# Patient Record
Sex: Male | Born: 1970 | Hispanic: Yes | Marital: Single | Smoking: Never smoker
Health system: Southern US, Community
[De-identification: ages and names within clinical notes are randomized; demographics above are authoritative.]

---

## 2014-09-09 ENCOUNTER — Inpatient Hospital Stay (HOSPITAL_COMMUNITY)
Admission: EM | Admit: 2014-09-09 | Discharge: 2014-09-12 | DRG: 074 | Disposition: A | Discharge status: Home / Self Care | Payer: Self-pay | Attending: Internal Medicine | Admitting: Internal Medicine

## 2014-09-09 ENCOUNTER — Encounter (HOSPITAL_COMMUNITY): Payer: Self-pay | Admitting: Emergency Medicine

## 2014-09-09 ENCOUNTER — Emergency Department (HOSPITAL_COMMUNITY): Payer: Self-pay

## 2014-09-09 DIAGNOSIS — R739 Hyperglycemia, unspecified: Secondary | ICD-10-CM | POA: Diagnosis present

## 2014-09-09 DIAGNOSIS — E876 Hypokalemia: Secondary | ICD-10-CM | POA: Diagnosis present

## 2014-09-09 DIAGNOSIS — IMO0002 Reserved for concepts with insufficient information to code with codable children: Secondary | ICD-10-CM | POA: Diagnosis present

## 2014-09-09 DIAGNOSIS — E1165 Type 2 diabetes mellitus with hyperglycemia: Secondary | ICD-10-CM | POA: Diagnosis present

## 2014-09-09 DIAGNOSIS — R609 Edema, unspecified: Secondary | ICD-10-CM

## 2014-09-09 DIAGNOSIS — M869 Osteomyelitis, unspecified: Secondary | ICD-10-CM

## 2014-09-09 DIAGNOSIS — Z794 Long term (current) use of insulin: Secondary | ICD-10-CM

## 2014-09-09 DIAGNOSIS — E11610 Type 2 diabetes mellitus with diabetic neuropathic arthropathy: Principal | ICD-10-CM | POA: Diagnosis present

## 2014-09-09 LAB — BASIC METABOLIC PANEL
Anion gap: 8 (ref 5–15)
BUN: 18 mg/dL (ref 6–23)
CALCIUM: 9.3 mg/dL (ref 8.4–10.5)
CHLORIDE: 100 mmol/L (ref 96–112)
CO2: 26 mmol/L (ref 19–32)
CREATININE: 0.69 mg/dL (ref 0.50–1.35)
GFR calc Af Amer: >90 mL/min (ref >90)
GFR calc non Af Amer: >90 mL/min (ref >90)
Glucose, Bld: 328 mg/dL — ABNORMAL HIGH (ref 70–99)
Potassium: 4.1 mmol/L (ref 3.5–5.1)
Sodium: 134 mmol/L — ABNORMAL LOW (ref 135–145)

## 2014-09-09 LAB — CBC WITH DIFFERENTIAL/PLATELET
BASOS ABS: 0.1 10*3/uL (ref 0.0–0.1)
Basophils Relative: 1 % (ref 0–1)
Eosinophils Absolute: 0.2 10*3/uL (ref 0.0–0.7)
Eosinophils Relative: 2 % (ref 0–5)
HCT: 43.1 % (ref 39.0–52.0)
Hemoglobin: 15 g/dL (ref 13.0–17.0)
LYMPHS ABS: 2.6 10*3/uL (ref 0.7–4.0)
LYMPHS PCT: 25 % (ref 12–46)
MCH: 27.3 pg (ref 26.0–34.0)
MCHC: 34.8 g/dL (ref 30.0–36.0)
MCV: 78.5 fL (ref 78.0–100.0)
Monocytes Absolute: 0.6 10*3/uL (ref 0.1–1.0)
Monocytes Relative: 6 % (ref 3–12)
NEUTROS ABS: 6.7 10*3/uL (ref 1.7–7.7)
Neutrophils Relative %: 66 % (ref 43–77)
PLATELETS: 312 10*3/uL (ref 150–400)
RBC: 5.49 MIL/uL (ref 4.22–5.81)
RDW: 12.8 % (ref 11.5–15.5)
WBC: 10.2 10*3/uL (ref 4.0–10.5)

## 2014-09-09 MED ORDER — INSULIN ASPART 100 UNIT/ML ~~LOC~~ SOLN
0.0000 [IU] | SUBCUTANEOUS | Status: DC
  Administered 2014-09-10: 2 [IU] via SUBCUTANEOUS
  Administered 2014-09-10: 3 [IU] via SUBCUTANEOUS
  Administered 2014-09-10: 7 [IU] via SUBCUTANEOUS
  Administered 2014-09-10 (×2): 5 [IU] via SUBCUTANEOUS
  Administered 2014-09-11: 2 [IU] via SUBCUTANEOUS
  Administered 2014-09-11: 3 [IU] via SUBCUTANEOUS
  Administered 2014-09-11: 2 [IU] via SUBCUTANEOUS

## 2014-09-09 MED ORDER — IOHEXOL 300 MG/ML  SOLN
100.0000 mL | Freq: Once | INTRAMUSCULAR | Status: AC | PRN
  Administered 2014-09-09: 100 mL via INTRAVENOUS

## 2014-09-09 MED ORDER — SODIUM CHLORIDE 0.9 % IV SOLN
INTRAVENOUS | Status: DC
  Administered 2014-09-09 – 2014-09-11 (×4): via INTRAVENOUS

## 2014-09-09 MED ORDER — HYDROCODONE-ACETAMINOPHEN 5-325 MG PO TABS
2.0000 | ORAL_TABLET | Freq: Once | ORAL | Status: AC
  Administered 2014-09-09: 2 via ORAL

## 2014-09-09 MED ORDER — HEPARIN SODIUM (PORCINE) 5000 UNIT/ML IJ SOLN
5000.0000 [IU] | Freq: Three times a day (TID) | INTRAMUSCULAR | Status: DC
  Administered 2014-09-09 – 2014-09-12 (×9): 5000 [IU] via SUBCUTANEOUS
  Filled 2014-09-09 (×9): qty 1

## 2014-09-09 MED ORDER — MORPHINE SULFATE 2 MG/ML IJ SOLN
1.0000 mg | INTRAMUSCULAR | Status: DC | PRN
  Administered 2014-09-11 – 2014-09-12 (×2): 2 mg via INTRAVENOUS
  Filled 2014-09-09 (×2): qty 1

## 2014-09-09 MED ORDER — HYDROCODONE-ACETAMINOPHEN 5-325 MG PO TABS
ORAL_TABLET | ORAL | Status: AC
  Filled 2014-09-09: qty 2

## 2014-09-09 NOTE — ED Provider Notes (Addendum)
CSN: [id number]     Arrival date & time 09/09/14  1801 History   First MD Initiated Contact with Patient 09/09/14 1915    This chart was scribed for Doug Sou, MD by Marica Otter, ED Scribe. This patient was seen in room [facility] and the patient's care was started at 7:20 PM.  Chief Complaint  Patient presents with  . Foot Pain   The history is provided by the patient. No language interpreter was used.   Pcp: No PCP Per Patient HPI Comments: Ronnie Yates is a 44 y.o. male who presents to the Emergency Department complaining of atraumatic,  constant left foot pain with associated swelling onset 2 weeks ago. Pt reports the pain is made worse with walking. Pt reports taking ibuprofen at home with some relief. Pt denies fever; chronic health conditions; tobacco use; recreational drug use; EtOH use; medicinal allergies. No other associated symptoms  History reviewed. No pertinent past medical history. History reviewed. No pertinent past surgical history. History reviewed. No pertinent family history. History  Substance Use Topics  . Smoking status: Never Smoker   . Smokeless tobacco: Not on file  . Alcohol Use: No    Review of Systems  Constitutional: Negative.   HENT: Negative.   Respiratory: Negative.   Cardiovascular: Negative.   Gastrointestinal: Negative.   Musculoskeletal: Positive for arthralgias.       Left foot pain  Skin: Negative.   Neurological: Negative.   Psychiatric/Behavioral: Negative.   All other systems reviewed and are negative.     Allergies  Review of patient's allergies indicates no known allergies.  Home Medications   Prior to Admission medications   Medication Sig Start Date End Date Taking? Authorizing Provider  ibuprofen (ADVIL,MOTRIN) 200 MG tablet Take 400 mg by mouth every 6 (six) hours as needed for mild pain or moderate pain.   Yes Historical Provider, MD   Triage Vitals: BP 155/94 mmHg  Pulse 96  Temp(Src) 97.8 F (36.6 C) (Oral)   Resp 20  Ht  (1.727 m)  Wt 190 lb (86.183 kg)  BMI 28.90 kg/m2  SpO2 100% Physical Exam  Constitutional: He appears well-developed and well-nourished. No distress.  HENT:  Head: Normocephalic and atraumatic.  Eyes: Conjunctivae are normal. Pupils are equal, round, and reactive to light.  Neck: Neck supple. No tracheal deviation present. No thyromegaly present.  Cardiovascular: Normal rate and regular rhythm.   No murmur heard. Pulmonary/Chest: Effort normal and breath sounds normal.  Abdominal: Soft. Bowel sounds are normal. He exhibits no distension. There is no tenderness.  Musculoskeletal: Normal range of motion. He exhibits no edema or tenderness.  Left lower extremity skin intact, foot diffusely swollen, tender particularly at arch and plantar surface. Not red or warm. No crepitance. DP pulse 2+. Good capillary refill. All other extremity is without redness swelling or tenderness neurovascularly intact  Neurological: He is alert. Coordination normal.  Skin: Skin is warm and dry. No rash noted.  Psychiatric: He has a normal mood and affect.  Nursing note and vitals reviewed.   ED Course  Procedures (including critical care time) DIAGNOSTIC STUDIES: Oxygen Saturation is 100% on RA, nl by my interpretation.    COORDINATION OF CARE: 7:23 PM-Discussed treatment plan which includes imaging with pt at bedside and pt agreed to plan.   Labs Review Labs Reviewed - No data to display  Imaging Review No results found.   EKG Interpretation None     10:30 PM pain improved after treatment with Norco.  Results for orders placed or performed during the hospital encounter of 09/09/14  CBC with Differential/Platelet  Result Value Ref Range   WBC 10.2 4.0 - 10.5 K/uL   RBC 5.49 4.22 - 5.81 MIL/uL   Hemoglobin 15.0 13.0 - 17.0 g/dL   HCT 16.1 09.6 - 04.5 %   MCV 78.5 78.0 - 100.0 fL   MCH 27.3 26.0 - 34.0 pg   MCHC 34.8 30.0 - 36.0 g/dL   RDW 40.9 81.1 - 91.4 %   Platelets  312 150 - 400 K/uL   Neutrophils Relative % 66 43 - 77 %   Neutro Abs 6.7 1.7 - 7.7 K/uL   Lymphocytes Relative 25 12 - 46 %   Lymphs Abs 2.6 0.7 - 4.0 K/uL   Monocytes Relative 6 3 - 12 %   Monocytes Absolute 0.6 0.1 - 1.0 K/uL   Eosinophils Relative 2 0 - 5 %   Eosinophils Absolute 0.2 0.0 - 0.7 K/uL   Basophils Relative 1 0 - 1 %   Basophils Absolute 0.1 0.0 - 0.1 K/uL  Basic metabolic panel  Result Value Ref Range   Sodium 134 (L) 135 - 145 mmol/L   Potassium 4.1 3.5 - 5.1 mmol/L   Chloride 100 96 - 112 mmol/L   CO2 26 19 - 32 mmol/L   Glucose, Bld 328 (H) 70 - 99 mg/dL   BUN 18 6 - 23 mg/dL   Creatinine, Ser 7.82 0.50 - 1.35 mg/dL   Calcium 9.3 8.4 - 95.6 mg/dL   GFR calc non Af Amer >90 >90 mL/min   GFR calc Af Amer >90 >90 mL/min   Anion gap 8 5 - 15   Ct Foot Left W Contrast  09/09/2014   CLINICAL DATA:  Left foot pain, swelling, tenderness for 2 weeks. No known injury.  EXAM: CT OF THE LEFT FOOT WITH CONTRAST  TECHNIQUE: Multidetector CT imaging was performed following the standard protocol during bolus administration of intravenous contrast.  CONTRAST:  OMNIPAQUE IOHEXOL 300 MG/ML  SOLN  COMPARISON:  None.  FINDINGS: Charcot appearance of the foot with disorganization, osseous destructive change, and abnormal alignment of the midfoot. Multifocal well-defined erosions involving the base of the metatarsals and at the tarsal metatarsal articulations. With sparing of the fifth metatarsal. Diffuse soft tissue edema of the foot. Within the intrinsic musculature of the foot subjacent to the first metatarsal with an elongated and 2.3 x 1.4 x 1.2 cm fluid collection, this is adjacent to a in small hyperdensity that may be an osseous fragment. Diffuse muscular edema about the intrinsic musculature of the foot. There is a plantar calcaneal spur and Achilles tendon enthesophyte. Limited assessment for acute fracture given the underlying chronic change.  IMPRESSION: 1. Charcot appearance  of the foot with osseous destructive change and disorganization involving the midfoot with multifocal ill-defined lucencies. Acute osteomyelitis is difficult to differentiate from chronic osseous change in the setting. 2. Small intramuscular fluid collection involving the intrinsic musculature of fluid subjacent to the first metatarsal measuring 2.3 x 1.4 x 1.2 cm, with a small probable osseous fragment, concerning for abscess.   Electronically Signed   By: Rubye Oaks M.D.   On: 09/09/2014 21:43    MDM  I spoke with Dr.Keeling from orthopedics who suggests that patient needs transfer to either [facility] or to [facility] for surgery on foot, infectious disease consult, glycemic control and intravenous antibiotics. Dr.Xu orthopedic consultant at [facility] contacted. He suggests patient  needs MRI scan to determine if infectious etiology.he suggests will withhold antibiotics presently until patient has MRI scan, which will aid in decision of whether etiology is infectious. he will see patient in consultation. [facility] Dr. Alvester MorinNewton will make arrangements for transfer Final diagnoses:  None  Dx #1 Charcot foot #2Hyperglcemia    I personally performed the services described in this documentation, which was scribed in my presence. The recorded information has been reviewed and is accurate.     Doug SouSam Shelia Magallon, MD 09/09/14 [id number]  Doug SouSam Peniel Hass, MD 09/09/14 [id number]

## 2014-09-09 NOTE — ED Notes (Signed)
Pt reports left foot pain x2 weeks. Pt denies any known injury. Pt left foot warm to touch. No obvious deformity noted.

## 2014-09-09 NOTE — H&P (Signed)
Hospitalist Admission History and Physical  Patient name: Ronnie Yates Medical record number: [id number] Date of birth: 09-22-1970 Age: 44 y.o. Gender: male  Primary Care Provider: No PCP Per Patient  Chief Complaint: osteomyelitis, hyperglycemia   History of Present Illness:This is a 44 y.o. year old male with no known prior  medical history presenting with osteomyelitis and hyperglycemia. Pt primarily spanish speaking w/ moderate amount of english. Refused interpreter. Pt reports swelling of L foot over past 2 weeks. Mild pain. Denies any fevers, chills, nausea. No purulent drainage. Progressively worsening swelling. Denies any hx/o trauma or infection in the past. Denies any known hx/o medical problems in the past.  Presented to ER afebrile, hemodynamically stable. CBC WNL. BMET noted for glu 328. Bicarb 26. CT foot 1. Charcot appearance of the foot with osseous destructive change and disorganization involving the midfoot with multifocal ill-defined lucencies. Acute osteomyelitis is difficult to differentiate from chronic osseous change in the setting. 2. Small intramuscular fluid collection involving the intrinsic musculature of fluid subjacent to the first metatarsal measuring 2.3 x 1.4 x 1.2 cm, with a small probable osseous fragment, concerning for abscess.  EDP discussed case w/ on call ortho at [facility] who recommends transfer to [facility] for further eval. EDP deferring abx pending ortho at [facility] call back w/ recommendations.   Assessment and Plan: Ronnie Yates is a 44 y.o. year old male presenting with osteomyelitis, hyperglycemia    Active Problems:   Osteomyelitis   Hyperglycemia   1- Osteomyelitis  -noted charcot and abscess changes on CT  -suspect sub acute on chronic issue in setting of likely undiagnosed DM -pending preliminary recs from ortho at [facility] per EDP  -defer abx in the interim -blood cultures  -transfer to [facility] -f/u ortho recs   2-Hyperglycemia -SSI -A1C -UA -urine  microalbumin   FEN/GI: NPO PMN  Prophylaxis: sub q heparin  Disposition: pending further evaluation  Code Status:Full Code    Patient Active Problem List   Diagnosis Date Noted  . Osteomyelitis 09/09/2014   Past Medical History: History reviewed. No pertinent past medical history.  Past Surgical History: History reviewed. No pertinent past surgical history.  Social History: History   Social History  . Marital Status: Single    Spouse Name: N/A  . Number of Children: N/A  . Years of Education: N/A   Social History Main Topics  . Smoking status: Never Smoker   . Smokeless tobacco: Not on file  . Alcohol Use: No  . Drug Use: No  . Sexual Activity: Not on file   Other Topics Concern  . None   Social History Narrative  . None    Family History: History reviewed. No pertinent family history.  Allergies: No Known Allergies  Current Facility-Administered Medications  Medication Dose Route Frequency Provider Last Rate Last Dose  . 0.9 %  sodium chloride infusion   Intravenous Continuous Floydene Flock, MD      . heparin injection 5,000 Units  5,000 Units Subcutaneous 3 times per day Floydene Flock, MD      . Melene Muller ON 09/10/2014] insulin aspart (novoLOG) injection 0-9 Units  0-9 Units Subcutaneous 6 times per day Floydene Flock, MD      . morphine 2 MG/ML injection 1-2 mg  1-2 mg Intravenous Q3H PRN Floydene Flock, MD       Current Outpatient Prescriptions  Medication Sig Dispense Refill  . ibuprofen (ADVIL,MOTRIN) 200 MG tablet Take 400 mg by mouth every 6 (six) hours  as needed for mild pain or moderate pain.     Review Of Systems: 12 point ROS negative except as noted above in HPI.  Physical Exam: Filed Vitals:   09/09/14 2248  BP: 116/75  Pulse: 80  Temp: 97.7 F (36.5 C)  Resp: 20    General: alert and cooperative HEENT: PERRLA and extra ocular movement intact Heart: S1, S2 normal, no murmur, rub or gallop, regular rate and rhythm Lungs: clear to  auscultation, no wheezes or rales and unlabored breathing Abdomen: abdomen is soft without significant tenderness, masses, organomegaly or guarding Extremities: L foot swelling, mild TTP      Skin:no rashes Neurology: normal without focal findings  Labs and Imaging: Lab Results  Component Value Date/Time   NA 134* 09/09/2014 07:30 PM   K 4.1 09/09/2014 07:30 PM   CL 100 09/09/2014 07:30 PM   CO2 26 09/09/2014 07:30 PM   BUN 18 09/09/2014 07:30 PM   CREATININE 0.69 09/09/2014 07:30 PM   GLUCOSE 328* 09/09/2014 07:30 PM   Lab Results  Component Value Date   WBC 10.2 09/09/2014   HGB 15.0 09/09/2014   HCT 43.1 09/09/2014   MCV 78.5 09/09/2014   PLT 312 09/09/2014    Ct Foot Left W Contrast  09/09/2014   CLINICAL DATA:  Left foot pain, swelling, tenderness for 2 weeks. No known injury.  EXAM: CT OF THE LEFT FOOT WITH CONTRAST  TECHNIQUE: Multidetector CT imaging was performed following the standard protocol during bolus administration of intravenous contrast.  CONTRAST:  100mL OMNIPAQUE IOHEXOL 300 MG/ML  SOLN  COMPARISON:  None.  FINDINGS: Charcot appearance of the foot with disorganization, osseous destructive change, and abnormal alignment of the midfoot. Multifocal well-defined erosions involving the base of the metatarsals and at the tarsal metatarsal articulations. With sparing of the fifth metatarsal. Diffuse soft tissue edema of the foot. Within the intrinsic musculature of the foot subjacent to the first metatarsal with an elongated and 2.3 x 1.4 x 1.2 cm fluid collection, this is adjacent to a in small hyperdensity that may be an osseous fragment. Diffuse muscular edema about the intrinsic musculature of the foot. There is a plantar calcaneal spur and Achilles tendon enthesophyte. Limited assessment for acute fracture given the underlying chronic change.  IMPRESSION: 1. Charcot appearance of the foot with osseous destructive change and disorganization involving the midfoot with  multifocal ill-defined lucencies. Acute osteomyelitis is difficult to differentiate from chronic osseous change in the setting. 2. Small intramuscular fluid collection involving the intrinsic musculature of fluid subjacent to the first metatarsal measuring 2.3 x 1.4 x 1.2 cm, with a small probable osseous fragment, concerning for abscess.   Electronically Signed   By: Rubye OaksMelanie  Ehinger M.D.   On: 09/09/2014 21:43           Doree AlbeeSteven Jasier Calabretta MD  Pager: [phone]

## 2014-09-10 ENCOUNTER — Inpatient Hospital Stay (HOSPITAL_COMMUNITY): Payer: Self-pay

## 2014-09-10 DIAGNOSIS — R739 Hyperglycemia, unspecified: Secondary | ICD-10-CM

## 2014-09-10 DIAGNOSIS — M79672 Pain in left foot: Secondary | ICD-10-CM

## 2014-09-10 LAB — GLUCOSE, CAPILLARY
GLUCOSE-CAPILLARY: 272 mg/dL — AB (ref 70–99)
GLUCOSE-CAPILLARY: 200 mg/dL — AB (ref 70–99)
GLUCOSE-CAPILLARY: 258 mg/dL — AB (ref 70–99)
Glucose-Capillary: 225 mg/dL — ABNORMAL HIGH (ref 70–99)
Glucose-Capillary: 303 mg/dL — ABNORMAL HIGH (ref 70–99)

## 2014-09-10 LAB — URINALYSIS, ROUTINE W REFLEX MICROSCOPIC
Bilirubin Urine: NEGATIVE
Glucose, UA: >1000 mg/dL — AB
Hgb urine dipstick: NEGATIVE
KETONES UR: NEGATIVE mg/dL
Leukocytes, UA: NEGATIVE
Nitrite: NEGATIVE
PROTEIN: 30 mg/dL — AB
Specific Gravity, Urine: >1.046 — ABNORMAL HIGH (ref 1.005–1.030)
UROBILINOGEN UA: 0.2 mg/dL (ref 0.0–1.0)
pH: 5 (ref 5.0–8.0)

## 2014-09-10 LAB — URINE MICROSCOPIC-ADD ON

## 2014-09-10 LAB — SEDIMENTATION RATE: SED RATE: 7 mm/h (ref 0–16)

## 2014-09-10 MED ORDER — GADOBENATE DIMEGLUMINE 529 MG/ML IV SOLN
19.0000 mL | Freq: Once | INTRAVENOUS | Status: AC
  Administered 2014-09-10: 19 mL via INTRAVENOUS

## 2014-09-10 MED ORDER — LIVING WELL WITH DIABETES BOOK - IN SPANISH
Freq: Once | Status: DC
  Filled 2014-09-10: qty 1

## 2014-09-10 NOTE — Progress Notes (Signed)
Patient was downstairs for MRI. Will come back at lunch to evaluate patient. xrays of foot ordered also.  Mayra ReelN. Michael Karimah Winquist, MD [facility] [phone] 8:03 AM

## 2014-09-10 NOTE — Progress Notes (Signed)
Orthopedic Tech Progress Note Patient Details:  Ronnie ShellingJuan Yates 01-18-1971 [id number]  Ortho Devices Type of Ortho Device: CAM walker Ortho Device/Splint Location: lle Ortho Device/Splint Interventions: Application   Tyaira Heward 09/10/2014, 2:18 PM

## 2014-09-10 NOTE — Plan of Care (Signed)
Problem: Food- and Nutrition-Related Knowledge Deficit (NB-1.1) Goal: Nutrition education Formal process to instruct or train a patient/client in a skill or to impart knowledge to help patients/clients voluntarily manage or modify food choices and eating behavior to maintain or improve health. Outcome: Completed/Met Date Met:  09/10/14  RD consulted for nutrition education regarding diabetes.   RD provided "Carbohydrate Counting for People with Diabetes" handout from the Academy of Nutrition and Dietetics. Discussed different food groups and their effects on blood sugar, emphasizing carbohydrate-containing foods. Provided list of carbohydrates and recommended serving sizes of common foods.  Discussed importance of controlled and consistent carbohydrate intake throughout the day. Recommended 4-5 servings of carbohydrates at each meal. Emphasized adequate protein intake. Provided examples of ways to balance meals/snacks and encouraged intake of high-fiber, whole grain complex carbohydrates. Discussed diabetic friendly drink options. Teach back method used.  Expect good compliance.  Body mass index is 30.31 kg/(m^2). Pt meets criteria for class I obesity based on current BMI.  Current diet order is carbohydrate modified. Diet has just been advanced at noon. Pt reports appetite is good. Labs and medications reviewed. No further nutrition interventions warranted at this time. RD contact information provided. If additional nutrition issues arise, please re-consult RD.  Stephanie La, MS, RD, LDN Pager # [phone] After hours/ weekend pager # [phone]         

## 2014-09-10 NOTE — Progress Notes (Signed)
Patient left for MRI.

## 2014-09-10 NOTE — Progress Notes (Signed)
[id number] at 0110 via [place] [facility] [place].  Patient is alert and oriented.  Spanish is his first language but he speaks [place] fluently and can let his needs be known.

## 2014-09-10 NOTE — Progress Notes (Signed)
Inpatient Diabetes Program Recommendations  AACE/ADA: New Consensus Statement on Inpatient Glycemic Control (2013)  Target Ranges:  Prepandial:   less than 140 mg/dL      Peak postprandial:   less than 180 mg/dL (1-2 hours)      Critically ill patients:  140 - 180 mg/dL     Results for Ronnie ShellingEREZ, [name] (MRN [id number]) as of 09/10/2014 14:27  Ref. 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  Explained what an A1C is, basic pathophysiology of DM Type 2, basic home care, basic diabetes diet nutrition principles, importance of checking CBGs and maintaining good CBG control to prevent long-term and short-term complications.  Also reviewed blood sugar goals at home.    **RNs to provide ongoing basic DM education at bedside with this patient.  Have ordered educational booklet and DM videos.  Have also placed RD consult for DM diet education for this patient.  **A1c results pending.  Patient will likely need oral DM medications at time of discharge.    MD- Please consider the following:  1.Change Novolog SSI to Moderate scale tid ac + HS (currently ordered as Sensitive scale Q4 hours)  2. Please consider starting Metformin 500 mg bid for patient if he has no contraindications- Will need to wait at least 48 hours since patient had contrast today with MRI     Will follow Ambrose FinlandJeannine Johnston Thoren Hosang RN, MSN, CDE Diabetes Coordinator Inpatient Diabetes Program Team Pager: [phone] (8a-5p)

## 2014-09-10 NOTE — Progress Notes (Signed)
[facility] PROGRESS NOTE  Ronnie Yates ZOX:[id number] DOB: 04/10/1971 DOA: 09/09/2014  PCP: Does not have a PCP  Brief HPI: 44 year old male of Hispanic origin without any known prior medical history, presented with pain in his left foot and was also noted to have hyperglycemia. He likely has undiagnosed diabetes. 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 He was subsequently transferred to [facility] for further management.  Consultants: Orthopedics  Procedures: None yet  Antibiotics: none  Subjective: Patient states that his pain is reasonably well controlled. He did not know he had diabetes. Denies any other complaints.  Objective: Vital Signs  Filed Vitals:   09/10/14 0000 09/10/14 0119 09/10/14 0130 09/10/14 0645  BP: 145/97 162/88  141/80  Pulse: 76 76  75  Temp:  98 F (36.7 C)  97.8 F (36.6 C)  TempSrc:  Oral  Oral  Resp: 14     Height:      Weight:   90.402 kg (199 lb 4.8 oz)   SpO2: 99% 99%  100%   No intake or output data in the 24 hours ending 09/10/14 1157 Filed Weights   09/09/14 1815 09/10/14 0130  Weight: 86.183 kg (190 lb) 90.402 kg (199 lb 4.8 oz)    General appearance: alert, cooperative, appears stated age and no distress Neck: no adenopathy, no carotid bruit, no JVD, supple, symmetrical, trachea midline and thyroid not enlarged, symmetric, no tenderness/mass/nodules Resp: clear to auscultation bilaterally Cardio: regular rate and rhythm, S1, S2 normal, no murmur, click, rub or gallop GI: soft, non-tender; bowel sounds normal; no masses,  no organomegaly Extremities: right foot normal. Left foot was swollen, slightly warm to touch. Tender to palpation diffusely. No open wounds noted. Pulses were appreciated. Neurologic: Alert and oriented 3. No focal neurological signs are noted.  Lab Results:  Basic Metabolic Panel:  Recent Labs Lab 09/09/14 1930  NA 134*  K 4.1  CL 100  CO2 26  GLUCOSE 328*  BUN 18   CREATININE 0.69  CALCIUM 9.3   CBC:  Recent Labs Lab 09/09/14 1930  WBC 10.2  NEUTROABS 6.7  HGB 15.0  HCT 43.1  MCV 78.5  PLT 312    CBG:  Recent Labs Lab 09/10/14 0129 09/10/14 0414  GLUCAP 272* 225*    Recent Results (from the past 240 hour(s))  Culture, blood (routine x 2)     Status: None (Preliminary result)   Collection Time: 09/09/14 11:20 PM  Result Value Ref Range Status   Specimen Description BLOOD RIGHT HAND  Final   Special Requests BOTTLES DRAWN AEROBIC AND ANAEROBIC [facility] EACH  Final   Culture PENDING  Incomplete   Report Status PENDING  Incomplete  Culture, blood (routine x 2)     Status: None (Preliminary result)   Collection Time: 09/09/14 11:35 PM  Result Value Ref Range Status   Specimen Description BLOOD LEFT ANTECUBITAL  Final   Special Requests BOTTLES DRAWN AEROBIC AND ANAEROBIC 8CC EACH  Final   Culture PENDING  Incomplete   Report Status PENDING  Incomplete      Studies/Results: Ct Foot Left W Contrast  09/09/2014   CLINICAL DATA:  Left foot pain, swelling, tenderness for 2 weeks. No known injury.  EXAM: CT OF THE LEFT FOOT WITH CONTRAST  TECHNIQUE: Multidetector CT imaging was performed following the standard protocol during bolus administration of intravenous contrast.  CONTRAST:  OMNIPAQUE IOHEXOL 300 MG/ML  SOLN  COMPARISON:  None.  FINDINGS: Charcot appearance  of the foot with disorganization, osseous destructive change, and abnormal alignment of the midfoot. Multifocal well-defined erosions involving the base of the metatarsals and at the tarsal metatarsal articulations. With sparing of the fifth metatarsal. Diffuse soft tissue edema of the foot. Within the intrinsic musculature of the foot subjacent to the first metatarsal with an elongated and 2.3 x 1.4 x 1.2 cm fluid collection, this is adjacent to a in small hyperdensity that may be an osseous fragment. Diffuse muscular edema about the intrinsic musculature of the foot. There is a  plantar calcaneal spur and Achilles tendon enthesophyte. Limited assessment for acute fracture given the underlying chronic change.  IMPRESSION: 1. Charcot appearance of the foot with osseous destructive change and disorganization involving the midfoot with multifocal ill-defined lucencies. Acute osteomyelitis is difficult to differentiate from chronic osseous change in the setting. 2. Small intramuscular fluid collection involving the intrinsic musculature of fluid subjacent to the first metatarsal measuring 2.3 x 1.4 x 1.2 cm, with a small probable osseous fragment, concerning for abscess.   Electronically Signed   By: Rubye OaksMelanie  Ehinger M.D.   On: 09/09/2014 21:43   Mr Foot Left W Wo Contrast  09/10/2014   CLINICAL DATA:  LEFT foot swelling over the last 2 weeks. Osteomyelitis.No systemic infectious symptoms.  EXAM: MRI OF THE LEFT FOREFOOT WITHOUT AND WITH CONTRAST  TECHNIQUE: Multiplanar, multisequence MR imaging was performed both before and after administration of intravenous contrast.  CONTRAST:  19mL MULTIHANCE GADOBENATE DIMEGLUMINE 529 MG/ML IV SOLN  COMPARISON:  CT 09/09/2014.  FINDINGS: Severe destructive changes of the midfoot are present with bone marrow edema in the midfoot bones and in the metatarsals. 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  Fluid collection identified on prior CT is again noted. The entire plantar foot musculature shows enhancement and this fluid collection shows no enhancement centrally. The bony fragment demonstrated on prior CT is centrally within the collection. The collection measures 15 mm plantar to dorsal, 12 mm transverse and 22 mm in the long axis of the foot. This collection is plantar to the first and second metatarsal shafts.  Visible portions of the ankle appear normal. Old plantar fasciitis. Achilles tendon appears intact. Diffuse leg, ankle and foot edema. Severe first MTP joint osteoarthritis.  The  superficial soft tissues show phlegmon, edema and enhancement. There is 1 possible tiny area of ulceration along the medial forefoot adjacent to the first metatarsal shaft (image 31 series 10) however this does not extend into the deep soft tissues or bone and this may simply represent artifact from compression of the skin surface.  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 Correlation with hemoglobin A1C is recommended. A diffuse midfoot infection is considered unlikely, especially in the absence of an ulceration. Forefoot fluid collection is probably sequela of destructive changes of the midfoot, likely hematoma or ganglion cyst. 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   Electronically Signed   By: Andreas NewportGeoffrey  Lamke M.D.   On: 09/10/2014 08:31   Dg Foot Complete Left  09/10/2014   CLINICAL DATA:  Foot swelling for 2 weeks  EXAM: LEFT FOOT - COMPLETE 3+ VIEW  COMPARISON:  None.  FINDINGS: There is a Lisfranc fracture, dislocation, with disorganization and fragmentation. The base of the first metatarsal is completely displaced laterally  with respect to the medial cuneiform. There is bone loss at the medial first metatarsal base likely related to a fracture. The base of the second metatarsal is separated from the medial cuneiform with irregularity compatible with a fracture dislocation. The remainder of the 3 metatarsals are subluxated laterally as well with respect to the role of tarsal bones. All of these findings have a chronic or subacute appearance.  IMPRESSION: Chronic or subacute fracture and Lisfranc dislocation most consistent with a Charcot foot or neuropathic diabetic foot.   Electronically Signed   By: Jolaine Click M.D.   On: 09/10/2014 09:18    Medications:    Scheduled: . heparin  5,000 Units Subcutaneous 3 times per day  . insulin aspart  0-9 Units Subcutaneous 6 times per day   Continuous: . sodium chloride 125 mL/hr at 09/10/14 1150   ZOX:[id number] injection  Assessment/Plan:  Active Problems:   Osteomyelitis   Hyperglycemia    Swollen left foot CT and MRI reports reviewed. Destructive changes of the foot noted. Suggestive of Charcot. Await orthopedics input. Continue pain medications. She is not on any antibiotics currently. Further management per orthopedics.  Hyperglycemia/New onset diabetes Continue sliding scale coverage. A1c is pending. He will need diabetes education. He will need definitive management at the time of discharge. Will need follow-up with primary care.  DVT Prophylaxis: heparin    Code Status: full code  Family Communication:  Discussed with the patient  Disposition Plan: Not ready for discharge    LOS: 1 day   [facility]  [facility] Pager [phone] 09/10/2014, 11:57 AM  If 7PM-7AM, please contact night-coverage at [email or web address], password [facility]

## 2014-09-10 NOTE — Consult Note (Signed)
ORTHOPAEDIC CONSULTATION  REQUESTING PHYSICIAN: Osvaldo Shipper, MD  Chief Complaint: Left foot swelling  HPI: Ronnie Yates is a 44 y.o. male who complains of left foot swelling.  Found to have elevated BS of 300s at [employer].  Patient transferred down here for ortho eval.    History reviewed. No pertinent past medical history. History reviewed. No pertinent past surgical history. History   Social History  . Marital Status: Single    Spouse Name: N/A  . Number of Children: N/A  . Years of Education: N/A   Social History Main Topics  . Smoking status: Never Smoker   . Smokeless tobacco: Not on file  . Alcohol Use: No  . Drug Use: No  . Sexual Activity: Not on file   Other Topics Concern  . None   Social History Narrative  . None   History reviewed. No pertinent family history. No Known Allergies Prior to Admission medications   Medication Sig Start Date End Date Taking? Authorizing Provider  ibuprofen (ADVIL,MOTRIN) 200 MG tablet Take 400 mg by mouth every 6 (six) hours as needed for mild pain or moderate pain.   Yes Historical Provider, MD   Ct Foot Left W Contrast  09/09/2014   CLINICAL DATA:  Left foot pain, swelling, tenderness for 2 weeks. No known injury.  EXAM: CT OF THE LEFT FOOT WITH CONTRAST  TECHNIQUE: Multidetector CT imaging was performed following the standard protocol during bolus administration of intravenous contrast.  CONTRAST:  OMNIPAQUE IOHEXOL 300 MG/ML  SOLN  COMPARISON:  None.  FINDINGS: Charcot appearance of the foot with disorganization, osseous destructive change, and abnormal alignment of the midfoot. Multifocal well-defined erosions involving the base of the metatarsals and at the tarsal metatarsal articulations. With sparing of the fifth metatarsal. Diffuse soft tissue edema of the foot. Within the intrinsic musculature of the foot subjacent to the first metatarsal with an elongated and 2.3 x 1.4 x 1.2 cm fluid collection, this is adjacent  to a in small hyperdensity that may be an osseous fragment. Diffuse muscular edema about the intrinsic musculature of the foot. There is a plantar calcaneal spur and Achilles tendon enthesophyte. Limited assessment for acute fracture given the underlying chronic change.  IMPRESSION: 1. Charcot appearance of the foot with osseous destructive change and disorganization involving the midfoot with multifocal ill-defined lucencies. Acute osteomyelitis is difficult to differentiate from chronic osseous change in the setting. 2. Small intramuscular fluid collection involving the intrinsic musculature of fluid subjacent to the first metatarsal measuring 2.3 x 1.4 x 1.2 cm, with a small probable osseous fragment, concerning for abscess.   Electronically Signed   By: Rubye Oaks M.D.   On: 09/09/2014 21:43   Mr Foot Left W Wo Contrast  09/10/2014   CLINICAL DATA:  LEFT foot swelling over the last 2 weeks. Osteomyelitis.No systemic infectious symptoms.  EXAM: MRI OF THE LEFT FOREFOOT WITHOUT AND WITH CONTRAST  TECHNIQUE: Multiplanar, multisequence MR imaging was performed both before and after administration of intravenous contrast.  CONTRAST:  19mL MULTIHANCE GADOBENATE DIMEGLUMINE 529 MG/ML IV SOLN  COMPARISON:  CT 09/09/2014.  FINDINGS: Severe destructive changes of the midfoot are present with bone marrow edema in the midfoot bones and in the metatarsals. 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  Fluid collection identified on prior CT is again noted. The entire plantar foot musculature shows enhancement and this fluid collection shows no enhancement centrally. The bony fragment demonstrated on  prior CT is centrally within the collection. The collection measures 15 mm plantar to dorsal, 12 mm transverse and 22 mm in the long axis of the foot. This collection is plantar to the first and second metatarsal shafts.  Visible portions of the ankle appear normal.  Old plantar fasciitis. Achilles tendon appears intact. Diffuse leg, ankle and foot edema. Severe first MTP joint osteoarthritis.  The superficial soft tissues show phlegmon, edema and enhancement. There is 1 possible tiny area of ulceration along the medial forefoot adjacent to the first metatarsal shaft (image 31 series 10) however this does not extend into the deep soft tissues or bone and this may simply represent artifact from compression of the skin surface.  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 Correlation with hemoglobin A1C is recommended. A diffuse midfoot infection is considered unlikely, especially in the absence of an ulceration. Forefoot fluid collection is probably sequela of destructive changes of the midfoot, likely hematoma or ganglion cyst. 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   Electronically Signed   By: Andreas NewportGeoffrey  Lamke M.D.   On: 09/10/2014 08:31   Dg Foot Complete Left  09/10/2014   CLINICAL DATA:  Foot swelling for 2 weeks  EXAM: LEFT FOOT - COMPLETE 3+ VIEW  COMPARISON:  None.  FINDINGS: There is a Lisfranc fracture, dislocation, with disorganization and fragmentation. The base of the first metatarsal is completely displaced laterally with respect to the medial cuneiform. There is bone loss at the medial first metatarsal base likely related to a fracture. The base of the second metatarsal is separated from the medial cuneiform with irregularity compatible with a fracture dislocation. The remainder of the 3 metatarsals are subluxated laterally as well with respect to the role of tarsal bones. All of these findings have a chronic or subacute appearance.  IMPRESSION: Chronic or subacute fracture and Lisfranc dislocation most consistent with a  Charcot foot or neuropathic diabetic foot.   Electronically Signed   By: Jolaine ClickArthur  Hoss M.D.   On: 09/10/2014 09:18    Positive ROS: All other systems have been reviewed and were otherwise negative with the exception of those mentioned in the HPI and as above.  Physical Exam: General: Alert, no acute distress Cardiovascular: No pedal edema Respiratory: No cyanosis, no use of accessory musculature GI: No organomegaly, abdomen is soft and non-tender Skin: No lesions in the area of chief complaint Neurologic: Sensation intact distally Psychiatric: Patient is competent for consent with normal mood and affect Lymphatic: No axillary or cervical lymphadenopathy  MUSCULOSKELETAL:  - foot slightly warm - no cellulitis, erythema - good pulses - no open wounds  Assessment: Left charcot arthropathy  Plan: - this is not an infectious process, xrays, CT, MRI are all c/w with charcot arthropathy - may stop abx - needs CAM walker - needs strict NWB - f/u in office in 6 weeks - patient is newly diagnosed diabetic - needs diabetic f/u  Thank you for the consult and the opportunity to see Ronnie Yates  N. Glee ArvinMichael Xu, MD [facility] [phone] 1:10 PM

## 2014-09-11 DIAGNOSIS — M869 Osteomyelitis, unspecified: Secondary | ICD-10-CM

## 2014-09-11 LAB — CBC WITH DIFFERENTIAL/PLATELET
BASOS ABS: 0 10*3/uL (ref 0.0–0.1)
Basophils Relative: 1 % (ref 0–1)
EOS ABS: 0.2 10*3/uL (ref 0.0–0.7)
Eosinophils Relative: 3 % (ref 0–5)
HCT: 41 % (ref 39.0–52.0)
Hemoglobin: 13.9 g/dL (ref 13.0–17.0)
LYMPHS ABS: 2.3 10*3/uL (ref 0.7–4.0)
Lymphocytes Relative: 31 % (ref 12–46)
MCH: 27 pg (ref 26.0–34.0)
MCHC: 33.9 g/dL (ref 30.0–36.0)
MCV: 79.8 fL (ref 78.0–100.0)
MONOS PCT: 9 % (ref 3–12)
Monocytes Absolute: 0.7 10*3/uL (ref 0.1–1.0)
NEUTROS ABS: 4.1 10*3/uL (ref 1.7–7.7)
Neutrophils Relative %: 56 % (ref 43–77)
Platelets: 279 10*3/uL (ref 150–400)
RBC: 5.14 MIL/uL (ref 4.22–5.81)
RDW: 13.1 % (ref 11.5–15.5)
WBC: 7.4 10*3/uL (ref 4.0–10.5)

## 2014-09-11 LAB — GLUCOSE, CAPILLARY
GLUCOSE-CAPILLARY: 183 mg/dL — AB (ref 70–99)
Glucose-Capillary: 168 mg/dL — ABNORMAL HIGH (ref 70–99)
Glucose-Capillary: 193 mg/dL — ABNORMAL HIGH (ref 70–99)
Glucose-Capillary: 202 mg/dL — ABNORMAL HIGH (ref 70–99)
Glucose-Capillary: 206 mg/dL — ABNORMAL HIGH (ref 70–99)
Glucose-Capillary: 262 mg/dL — ABNORMAL HIGH (ref 70–99)

## 2014-09-11 LAB — COMPREHENSIVE METABOLIC PANEL
ALK PHOS: 147 U/L — AB (ref 39–117)
ALT: 17 U/L (ref 0–53)
AST: 17 U/L (ref 0–37)
Albumin: 3.3 g/dL — ABNORMAL LOW (ref 3.5–5.2)
Anion gap: 4 — ABNORMAL LOW (ref 5–15)
BILIRUBIN TOTAL: 0.7 mg/dL (ref 0.3–1.2)
BUN: 7 mg/dL (ref 6–23)
CALCIUM: 8.4 mg/dL (ref 8.4–10.5)
CHLORIDE: 105 mmol/L (ref 96–112)
CO2: 29 mmol/L (ref 19–32)
Creatinine, Ser: 0.59 mg/dL (ref 0.50–1.35)
GFR calc non Af Amer: >90 mL/min (ref >90)
Glucose, Bld: 207 mg/dL — ABNORMAL HIGH (ref 70–99)
Potassium: 3.4 mmol/L — ABNORMAL LOW (ref 3.5–5.1)
Sodium: 138 mmol/L (ref 135–145)
Total Protein: 6.2 g/dL (ref 6.0–8.3)

## 2014-09-11 LAB — HEMOGLOBIN A1C
Hgb A1c MFr Bld: 12.8 % — ABNORMAL HIGH (ref 4.8–5.6)
Mean Plasma Glucose: 321 mg/dL

## 2014-09-11 LAB — MICROALBUMIN, URINE: MICROALB UR: 23.9 mg/dL — AB (ref 0.00–1.89)

## 2014-09-11 MED ORDER — INSULIN ASPART 100 UNIT/ML ~~LOC~~ SOLN
0.0000 [IU] | Freq: Every day | SUBCUTANEOUS | Status: DC
  Administered 2014-09-11: 2 [IU] via SUBCUTANEOUS

## 2014-09-11 MED ORDER — INSULIN STARTER KIT- SYRINGES (SPANISH)
1.0000 | Freq: Once | Status: AC
  Administered 2014-09-11: 1
  Filled 2014-09-11: qty 1

## 2014-09-11 MED ORDER — INSULIN ASPART 100 UNIT/ML ~~LOC~~ SOLN
0.0000 [IU] | Freq: Three times a day (TID) | SUBCUTANEOUS | Status: DC
  Administered 2014-09-11: 3 [IU] via SUBCUTANEOUS
  Administered 2014-09-11 – 2014-09-12 (×2): 8 [IU] via SUBCUTANEOUS
  Administered 2014-09-12: 5 [IU] via SUBCUTANEOUS

## 2014-09-11 NOTE — Progress Notes (Signed)
[facility] PROGRESS NOTE  Ronnie Yates MOQ:[id number] DOB: 03/25/1971 DOA: 09/09/2014  PCP: Does not have a PCP  Brief HPI: 44 year old male of Hispanic origin without any known prior medical history, presented with pain in his left foot and was also noted to have hyperglycemia. He likely has undiagnosed diabetes. 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 He was subsequently transferred to [facility] for further management.  Consultants: Orthopedics  Procedures: None yet  Antibiotics: none  Subjective: Pt has no new complaints today. No acute issues reported overnight.  Objective: Vital Signs  Filed Vitals:   09/10/14 1253 09/10/14 1957 09/11/14 0433 09/11/14 1223  BP: 131/72 138/86 121/77 139/84  Pulse: 80 79 75 69  Temp: 98.1 F (36.7 C) 98.9 F (37.2 C) 97.5 F (36.4 C) 97.5 F (36.4 C)  TempSrc: Oral Oral Oral   Resp: _0 Height:      Weight:      SpO2: 98% 97% 100% 98%    Intake/Output Summary (Last 24 hours) at 09/11/14 1252 Last data filed at 09/11/14 0900  Gross per 24 hour  Intake 4142.5 ml  Output      0 ml  Net 4142.5 ml   Filed Weights   09/09/14 1815 09/10/14 0130  Weight: 86.183 kg (190 lb) 90.402 kg (199 lb 4.8 oz)    General appearance: Awake and Alert, in NAD Neck: no adenopathy, no carotid bruit, no JVD, supple, symmetrical, trachea midline and thyroid not enlarged, symmetric, no tenderness/mass/nodules Resp: no wheezes, no rhales Cardio: regular rate and rhythm, S1, S2 normal, no murmur, rub or gallop GI: soft, non-tender; bowel sounds normal; no masses,  no organomegaly Extremities: right foot normal. Left foot was swollen, slightly warm to touch. Tender to palpation diffusely. No open wounds noted. Pulses were appreciated. Neurologic: Alert and oriented 3. No focal neurological signs are noted.  Lab Results:  Basic Metabolic Panel:  Recent Labs Lab 09/09/14 1930 09/11/14 0448  NA 134*  138  K 4.1 3.4*  CL 100 105  CO2 26 29  GLUCOSE 328* 207*  BUN 18 7  CREATININE 0.69 0.59  CALCIUM 9.3 8.4   CBC:  Recent Labs Lab 09/09/14 1930 09/11/14 0448  WBC 10.2 7.4  NEUTROABS 6.7 4.1  HGB 15.0 13.9  HCT 43.1 41.0  MCV 78.5 79.8  PLT 312 279    CBG:  Recent Labs Lab 09/10/14 2048 09/11/14 0017 09/11/14 0438 09/11/14 0744 09/11/14 1153  GLUCAP 303* 168* 183* 206* 193*    Recent Results (from the past 240 hour(s))  Culture, blood (routine x 2)     Status: None (Preliminary result)   Collection Time: 09/09/14 11:20 PM  Result Value Ref Range Status   Specimen Description BLOOD RIGHT HAND  Final   Special Requests BOTTLES DRAWN AEROBIC AND ANAEROBIC [facility] EACH  Final   Culture PENDING  Incomplete   Report Status PENDING  Incomplete  Culture, blood (routine x 2)     Status: None (Preliminary result)   Collection Time: 09/09/14 11:35 PM  Result Value Ref Range Status   Specimen Description BLOOD LEFT ANTECUBITAL  Final   Special Requests BOTTLES DRAWN AEROBIC AND ANAEROBIC 8CC EACH  Final   Culture PENDING  Incomplete   Report Status PENDING  Incomplete      Studies/Results: Ct Foot Left W Contrast  09/09/2014   CLINICAL DATA:  Left foot pain, swelling, tenderness for 2 weeks. No known injury.  EXAM:  CT OF THE LEFT FOOT WITH CONTRAST  TECHNIQUE: Multidetector CT imaging was performed following the standard protocol during bolus administration of intravenous contrast.  CONTRAST:  167m OMNIPAQUE IOHEXOL 300 MG/ML  SOLN  COMPARISON:  None.  FINDINGS: Charcot appearance of the foot with disorganization, osseous destructive change, and abnormal alignment of the midfoot. Multifocal well-defined erosions involving the base of the metatarsals and at the tarsal metatarsal articulations. With sparing of the fifth metatarsal. Diffuse soft tissue edema of the foot. Within the intrinsic musculature of the foot subjacent to the first metatarsal with an elongated and 2.3 x  1.4 x 1.2 cm fluid collection, this is adjacent to a in small hyperdensity that may be an osseous fragment. Diffuse muscular edema about the intrinsic musculature of the foot. There is a plantar calcaneal spur and Achilles tendon enthesophyte. Limited assessment for acute fracture given the underlying chronic change.  IMPRESSION: 1. Charcot appearance of the foot with osseous destructive change and disorganization involving the midfoot with multifocal ill-defined lucencies. Acute osteomyelitis is difficult to differentiate from chronic osseous change in the setting. 2. Small intramuscular fluid collection involving the intrinsic musculature of fluid subjacent to the first metatarsal measuring 2.3 x 1.4 x 1.2 cm, with a small probable osseous fragment, concerning for abscess.   Electronically Signed   By: MJeb LeveringM.D.   On: 09/09/2014 21:43   Mr Foot Left W Wo Contrast  09/10/2014   CLINICAL DATA:  LEFT foot swelling over the last 2 weeks. Osteomyelitis.No systemic infectious symptoms.  EXAM: MRI OF THE LEFT FOREFOOT WITHOUT AND WITH CONTRAST  TECHNIQUE: Multiplanar, multisequence MR imaging was performed both before and after administration of intravenous contrast.  CONTRAST:  140mMULTIHANCE GADOBENATE DIMEGLUMINE 529 MG/ML IV SOLN  COMPARISON:  CT 09/09/2014.  FINDINGS: Severe destructive changes of the midfoot are present with bone marrow edema in the midfoot bones and in the metatarsals. 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  Fluid collection identified on prior CT is again noted. The entire plantar foot musculature shows enhancement and this fluid collection shows no enhancement centrally. The bony fragment demonstrated on prior CT is centrally within the collection. The collection measures 15 mm plantar to dorsal, 12 mm transverse and 22 mm in the long axis of the foot. This collection is plantar to the first and second metatarsal shafts.   Visible portions of the ankle appear normal. Old plantar fasciitis. Achilles tendon appears intact. Diffuse leg, ankle and foot edema. Severe first MTP joint osteoarthritis.  The superficial soft tissues show phlegmon, edema and enhancement. There is 1 possible tiny area of ulceration along the medial forefoot adjacent to the first metatarsal shaft (image 31 series 10) however this does not extend into the deep soft tissues or bone and this may simply represent artifact from compression of the skin surface.  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 Correlation with hemoglobin A1C is recommended. A diffuse midfoot infection is considered unlikely, especially in the absence of an ulceration. Forefoot fluid collection is probably sequela of destructive changes of the midfoot, likely hematoma or ganglion cyst. 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   Electronically Signed   By: GeDereck Ligas.D.   On: 09/10/2014 08:31   Dg Foot Complete Left  09/10/2014  CLINICAL DATA:  Foot swelling for 2 weeks  EXAM: LEFT FOOT - COMPLETE 3+ VIEW  COMPARISON:  None.  FINDINGS: There is a Lisfranc fracture, dislocation, with disorganization and fragmentation. The base of the first metatarsal is completely displaced laterally with respect to the medial cuneiform. There is bone loss at the medial first metatarsal base likely related to a fracture. The base of the second metatarsal is separated from the medial cuneiform with irregularity compatible with a fracture dislocation. The remainder of the 3 metatarsals are subluxated laterally as well with respect to the role of tarsal bones. All of these findings have a chronic or subacute appearance.  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   Electronically Signed   By: Marybelle Killings M.D.   On: 09/10/2014 09:18    Medications:  Scheduled: . heparin  5,000 Units Subcutaneous 3 times per day  . insulin aspart  0-15 Units Subcutaneous TID WC  . insulin aspart  0-5 Units Subcutaneous QHS  . insulin starter kit- syringes  1 kit Other Once  . living well with diabetes book- in spanish   Does not apply Once   Continuous: . sodium chloride 125 mL/hr at 09/11/14 6144   TAO:OLDZWACA injection  Assessment/Plan:  Active Problems:   Osteomyelitis   Hyperglycemia    Swollen left foot CT and MRI reports reviewed. Destructive changes of the foot noted. Suggestive of Charcot. - Ortho on board and assisting with management.  Continue pain medications. She is not on any antibiotics currently. Further management per orthopedics.  Hyperglycemia/New onset diabetes - Continue sliding scale coverage. A1c is pending. He will need diabetes education. He will need definitive management at the time of discharge. Will need follow-up with primary care.  DVT Prophylaxis: heparin    Code Status: full code  Family Communication:  Discussed directly with the patient  Disposition Plan: Not ready for discharge    LOS: 2 days   Velvet Bathe  [facility] Pager [phone]  09/11/2014, 12:52 PM  If 7PM-7AM, please contact night-coverage at [email or web address], password [facility]

## 2014-09-11 NOTE — Progress Notes (Addendum)
Inpatient Diabetes Program Recommendations  AACE/ADA: New Consensus Statement on Inpatient Glycemic Control (2013)  Target Ranges:  Prepandial:   less than 140 mg/dL      Peak postprandial:   less than 180 mg/dL (1-2 hours)      Critically ill patients:  140 - 180 mg/dL     Results for SEVAG, SHEARN (MRN [id number]) as of 09/11/2014 12:47  Ref. Range 09/11/2014 00:17 09/11/2014 04:38 09/11/2014 07:44 09/11/2014 11:53  Glucose-Capillary Latest Range: 70-99 mg/dL 168 (H) 183 (H) 206 (H) 193 (H)    Results for CHAPIN, ARDUINI (MRN [id number]) as of 09/11/2014 12:47  Ref. 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   **RNs to provide ongoing basic DM education at bedside with this patient. Have ordered educational booklet and DM videos. Have also placed RD consult for DM diet education for this patient.  Dietitian saw patient yesterday (03/29).  **A1c 12.8%.  Per patient, patient told me the doctor told him he will need insulin and oral DM meds at time of d/c.  Have ordered insulin syringe teaching kit and have asked RN to please begin insulin teaching with patient asap.  Per patient, he has already practiced checking his CBGs as well.  **Discussed signs/symptoms of Hypoglycemia and Hyperglycemia with patient.  Also discussed treatment.  Patient stated he will buy some juice boxes for home.     MD- Please consider the following:   1. Please consider starting Metformin 500 mg bid for patient if he has no contraindications- Will need to wait at least 48 hours since patient had contrast yesterday with MRI  2. Please consider 70/30 insulin at time of d/c.  Patient does not have insurance and will not be able to afford  Lantus, Levemir, or Novolog since they are so expensive out of pocket.  70/30 insulin can be purchased at the [facility] and Wellness clinic for a low price and patient can also buy 70/30 at [facility] for $25 per vial.  Would start Novolog 70/30 Mix insulin- 12 units bidwc (this would be ~0.2 units/kg basal dosing)  3. Please also make sure to give patient Rx for CBG meter at time of d/c- Use Order # [id number]  4. Per care management, patient can go by the [facility] and Wellness clinic after d/c to pick up his prescriptions.   Will follow Wyn Quaker RN, MSN, CDE Diabetes Coordinator Inpatient Diabetes Program Team Pager: [phone] (8a-5p)

## 2014-09-11 NOTE — Care Management Note (Signed)
Utilization Review completed   Filipe Greathouse,RN, BSN,CCM 

## 2014-09-11 NOTE — Care Management Note (Signed)
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 Patient underwent a right total hip arthroplasty.     Action/Plan:   Case manager spoke with patient concerning home health and DME needs. Preoperatively setup with [facility], no changes. patient has RW and 3in1.Has family support at discharge.   Anticipated DC Date:  09/12/2014   Anticipated DC Plan:  HOME W HOME HEALTH SERVICES      DC Planning Services  CM consult      PAC Choice  HOME HEALTH   Choice offered to / List presented to:  C-1 Patient   DME arranged  NA        HH arranged  HH-2 PT      HH agency  [facility]   Status of service:  Completed, signed off Medicare Important Message given?  NA - LOS <3 / Initial given by admissions (If response is "NO", the following Medicare IM given date fields will be blank) Date Medicare IM given:   Medicare IM given by:   Date Additional Medicare IM given:   Additional Medicare IM given by:    Discharge Disposition:  HOME W HOME HEALTH SERVICES  Per UR Regulation:  Reviewed for med. necessity/level of care/duration of stay  

## 2014-09-12 DIAGNOSIS — E876 Hypokalemia: Secondary | ICD-10-CM | POA: Diagnosis present

## 2014-09-12 DIAGNOSIS — E1165 Type 2 diabetes mellitus with hyperglycemia: Secondary | ICD-10-CM

## 2014-09-12 DIAGNOSIS — E11610 Type 2 diabetes mellitus with diabetic neuropathic arthropathy: Principal | ICD-10-CM

## 2014-09-12 DIAGNOSIS — IMO0002 Reserved for concepts with insufficient information to code with codable children: Secondary | ICD-10-CM | POA: Diagnosis present

## 2014-09-12 LAB — CBC WITH DIFFERENTIAL/PLATELET
BASOS PCT: 1 % (ref 0–1)
Basophils Absolute: 0 10*3/uL (ref 0.0–0.1)
EOS ABS: 0.2 10*3/uL (ref 0.0–0.7)
Eosinophils Relative: 3 % (ref 0–5)
HCT: 40.3 % (ref 39.0–52.0)
HEMOGLOBIN: 13 g/dL (ref 13.0–17.0)
Lymphocytes Relative: 29 % (ref 12–46)
Lymphs Abs: 1.6 10*3/uL (ref 0.7–4.0)
MCH: 25.9 pg — ABNORMAL LOW (ref 26.0–34.0)
MCHC: 32.3 g/dL (ref 30.0–36.0)
MCV: 80.4 fL (ref 78.0–100.0)
MONOS PCT: 8 % (ref 3–12)
Monocytes Absolute: 0.5 10*3/uL (ref 0.1–1.0)
NEUTROS PCT: 59 % (ref 43–77)
Neutro Abs: 3.3 10*3/uL (ref 1.7–7.7)
Platelets: 273 10*3/uL (ref 150–400)
RBC: 5.01 MIL/uL (ref 4.22–5.81)
RDW: 13.3 % (ref 11.5–15.5)
WBC: 5.5 10*3/uL (ref 4.0–10.5)

## 2014-09-12 LAB — COMPREHENSIVE METABOLIC PANEL
ALBUMIN: 3.2 g/dL — AB (ref 3.5–5.2)
ALT: 17 U/L (ref 0–53)
ANION GAP: 8 (ref 5–15)
AST: 18 U/L (ref 0–37)
Alkaline Phosphatase: 139 U/L — ABNORMAL HIGH (ref 39–117)
BUN: 7 mg/dL (ref 6–23)
CO2: 26 mmol/L (ref 19–32)
Calcium: 8.7 mg/dL (ref 8.4–10.5)
Chloride: 104 mmol/L (ref 96–112)
Creatinine, Ser: 0.54 mg/dL (ref 0.50–1.35)
GFR calc non Af Amer: >90 mL/min (ref >90)
GLUCOSE: 220 mg/dL — AB (ref 70–99)
Potassium: 3.9 mmol/L (ref 3.5–5.1)
Sodium: 138 mmol/L (ref 135–145)
Total Bilirubin: 0.5 mg/dL (ref 0.3–1.2)
Total Protein: 6.4 g/dL (ref 6.0–8.3)

## 2014-09-12 LAB — GLUCOSE, CAPILLARY
GLUCOSE-CAPILLARY: 206 mg/dL — AB (ref 70–99)
Glucose-Capillary: 189 mg/dL — ABNORMAL HIGH (ref 70–99)
Glucose-Capillary: 210 mg/dL — ABNORMAL HIGH (ref 70–99)
Glucose-Capillary: 218 mg/dL — ABNORMAL HIGH (ref 70–99)

## 2014-09-12 MED ORDER — POTASSIUM CHLORIDE CRYS ER 20 MEQ PO TBCR
40.0000 meq | EXTENDED_RELEASE_TABLET | Freq: Once | ORAL | Status: AC
  Administered 2014-09-12: 40 meq via ORAL
  Filled 2014-09-12: qty 2

## 2014-09-12 MED ORDER — FREESTYLE SYSTEM KIT: 1.0000 | PACK | Freq: Three times a day (TID) | Status: AC

## 2014-09-12 MED ORDER — LISINOPRIL 5 MG PO TABS: 5.0000 mg | ORAL_TABLET | Freq: Every day | ORAL | Status: AC

## 2014-09-12 MED ORDER — INSULIN ASPART PROT & ASPART (70-30 MIX) 100 UNIT/ML ~~LOC~~ SUSP
12.0000 [IU] | Freq: Two times a day (BID) | SUBCUTANEOUS | Status: AC
Start: 2014-09-12 — End: ?

## 2014-09-12 MED ORDER — ALCOHOL SWABS 70 % PADS: 1.0000 "application " | MEDICATED_PAD | Freq: Two times a day (BID) | Status: AC

## 2014-09-12 MED ORDER — "INSULIN SYRINGE 28G X 1/2"" 1 ML MISC": 1.0000 "application " | Freq: Two times a day (BID) | Status: AC

## 2014-09-12 MED ORDER — ACCU-CHEK MULTICLIX LANCETS MISC: Status: AC

## 2014-09-12 MED ORDER — HYDROCODONE-ACETAMINOPHEN 5-500 MG PO TABS: 2.0000 | ORAL_TABLET | Freq: Four times a day (QID) | ORAL | Status: AC | PRN

## 2014-09-12 MED ORDER — METFORMIN HCL 500 MG PO TABS: 500.0000 mg | ORAL_TABLET | Freq: Two times a day (BID) | ORAL | Status: AC

## 2014-09-12 NOTE — Progress Notes (Addendum)
Pt gave self insulin did well watched blood glucose monitoring video,managing your diabetes,blood glucose monitoring verbalized understanding

## 2014-09-12 NOTE — Progress Notes (Signed)
Orthopedic Tech Progress Note Patient Details:  Felecia ShellingJuan Arreaga 14-Apr-1971 [id number] Crutches delivered and set to specifications patient worked with PT on Ortho Devices Type of Ortho Device: Crutches Ortho Device/Splint Location: lle Ortho Device/Splint Interventions: Ordered   Asia Burnett KanarisR Thompson 09/12/2014, 1:11 PM

## 2014-09-12 NOTE — Discharge Instructions (Addendum)
La diabetes mellitus y los alimentos (Diabetes Mellitus and Food) Es importante que controle su nivel de azcar en la sangre (glucosa). El nivel de glucosa en sangre depende en gran medida de lo que usted come. Comer alimentos saludables en las cantidades [place] a lo largo del Training and development officer, aproximadamente a la misma hora [employer], lo ayudar a Chief Technology Officer su nivel de Multimedia programmer. Tambin puede ayudarlo a retrasar o Patent attorney de la diabetes mellitus. Comer de [employer] saludable incluso puede ayudarlo a Chartered loss adjuster de presin arterial y a Science writer o Theatre manager un peso saludable.  CMO PUEDEN AFECTARME LOS ALIMENTOS? Carbohidratos Los carbohidratos afectan el nivel de glucosa en sangre ms que cualquier otro tipo de alimento. El nutricionista lo ayudar a Teacher, adult education cuntos carbohidratos puede consumir en cada comida y ensearle a contarlos. El recuento de carbohidratos es importante para mantener la glucosa en sangre en un nivel saludable, en especial si utiliza insulina o toma determinados medicamentos para la diabetes mellitus. Alcohol El alcohol puede provocar disminuciones sbitas de la glucosa en sangre (hipoglucemia), en especial si utiliza insulina o toma determinados medicamentos para la diabetes mellitus. La hipoglucemia es una afeccin que puede poner en peligro la vida. Los sntomas de la hipoglucemia (somnolencia, mareos y Data processing manager) son similares a los sntomas de haber consumido mucho alcohol.  Si el mdico lo autoriza a beber alcohol, hgalo con moderacin y siga estas pautas:  Las mujeres no deben beber ms de un trago por da, y los hombres no deben beber ms de dos tragos por Training and development officer. Un trago es igual a:  12 onzas (355 ml) de cerveza  5 onzas de vino (150 ml) de vino  1,5onzas (20m) de bebidas espirituosas  No beba con el estmago vaco.  Mantngase hidratado. Beba agua, gaseosas dietticas o t helado sin azcar.  Las gaseosas comunes, los jugos y  otros refrescos podran contener muchos carbohidratos y se dCivil Service fast streamer QU ALIMENTOS NO SE RECOMIENDAN? Cuando haga las elecciones de alimentos, es importante que recuerde que todos los alimentos son distintos. Algunos tienen menos nutrientes que otros por porcin, aunque podran tener la misma cantidad de caloras o carbohidratos. Es difcil darle al cuerpo lo que necesita cuando consume alimentos con menos nutrientes. Estos son algunos ejemplos de alimentos que debera evitar ya que contienen muchas caloras y carbohidratos, pero pocos nutrientes:  GPhysicist, medicaltrans (la mayora de los alimentos procesados incluyen grasas trans en la etiqueta de Informacin nutricional).  Gaseosas comunes.  Jugos.  Caramelos.  Dulces, como tortas, pasteles, rosquillas y gSan Dimas  Comidas fritas. QU ALIMENTOS PUEDO COMER? Consuma alimentos ricos en nutrientes, que nutrirn el cuerpo y lo mantendrn saludable. Los alimentos que debe comer tambin dependern de varios factores, como:  Las caloras que necesita.  Los medicamentos que toma.  Su peso.  El nivel de glucosa en sPamelia Center  El nWilliamsvillede presin arterial.  El nivel de colesterol. Tambin debe consumir una variedad de aDot [place] como:  Protenas, como carne, aves, pescado, tofu, frutos secos y semillas (las protenas de aMarco Islandmagros son mejores).  FLambert Mody  Verduras.  Productos lcteos, como lGoshen queso y yogur (descremados son mejores).  Panes, granos, pastas, cereales, arroz y frijoles.  Grasas, como aceite de oLivingston Wheeler mCentral African Republicsin grasas trans, aceite de canola, aguacate y aManalapan TODOS LOS QUE PADECEN DIABETES MELLITUS TIENEN EL MValleyPLAN DE CIna Dado que todas las personas que padecen diabetes mellitus son distintas, no hay un solo plan de comidas que funcione para todos. Es muy  importante que se rena con un nutricionista que lo ayudar a crear un plan de comidas adecuado para usted. Document Released: 09/07/2007  Document Revised: 06/05/2013 [facility] Patient Information 2015 [place], [place]. This information is not intended to replace advice given to you by your health care provider. Make sure you discuss any questions you have with your health care provider.   Control del nivel de glucosa en la sangre (Blood Glucose Monitoring) El control de la glucosa en la sangre (tambin llamada azcar en la sangre) lo ayudar a tener la diabetes bajo control. Tambin ayuda a que usted y Lexicographer la diabetes y determinen si el tratamiento es Engineer, manufacturing. POR QU HAY QUE CONTROLAR LA GLUCOSA EN LA SANGRE?  Esto puede ayudar a comprender de [employer] Stationers, la actividad fsica y los medicamentos inciden en los niveles de [place].  Le permite conocer el nivel de glucosa en la sangre en cualquier momento dado. Puede saber rpidamente si el nivel es bajo (hipoglucemia) o alto (hiperglucemia).  Puede ser de ayuda para que usted y el mdico sepan cmo Presenter, broadcasting,  y para entender cmo controlar una enfermedad o ajustar los medicamentos para hacer ejercicio. CUNDO DEBE HACERSE LAS PRUEBAS? El mdico lo ayudar a decidir con qu frecuencia deber [employer] niveles de glucosa en la [place]. Esto puede depender del tipo de diabetes que tenga, su control de la diabetes o los tipos de medicamentos que tome. Asegrese de anotar todos los valores de la glucosa en la [place], de modo que esta informacin pueda ser revisada por su mdico. A continuacin puede ver ejemplos de los momentos para Education officer, environmental la prueba que el mdico puede Neurosurgeon. Diabetes tipo1  [employer] prueba 4 veces por da si est bien controlado, [place] una bomba de [place] o se aplica muchas inyecciones diarias.  Si la diabetes no est bien controlada o si est enfermo, puede ser necesario que se controle con ms frecuencia.  Es recomendable que tambin se controle de [place] modo:  Antes y despus de hacer ejercicio.  Entre las  comidas y 2horas despus de Arts administrator.  Ocasionalmente, entre las 2:00a.m. y las 3:00a.m. Diabetes tipo2  Puede ser diferente para cada persona, pero, en general, si recibe insulina, hgase la prueba 4veces por da.  Si toma medicamentos por boca (va oral), hgase la prueba 2veces por da.  Si sigue una dieta controlada, hgase la prueba una vez por da.  Si la diabetes no est bien controlada o si est enfermo, puede ser necesario que se controle con ms frecuencia. CMO CONTROLAR EL NIVEL DE GLUCOSA EN LA SANGRE Insumos necesarios  Medidor de glucosa en la sangre.  Tiras reactivas para el medidor. Cada medidor tiene sus propias tiras reactivas. Debe usar las tiras reactivas correspondientes a su medidor.  Una aguja para pinchar (lanceta).  Un dispositivo que sujeta la lanceta (dispositivo de puncin).  Un diario o libro de anotaciones para [employer]. Procedimiento  Lave sus manos con agua y [place]. No se recomienda usar alcohol.  Pnchese el costado del dedo (no la punta) con Optometrist.  Apriete suavemente el dedo hasta que aparezca una pequea gota de [place].  Siga las instrucciones que vienen con el medidor para Public affairs consultant tira Firefighter, Contractor la sangre sobre la tira y usar el medidor de Horticulturist, commercial. Otras zonas de las que se puede tomar sangre para la prueba Algunos medidores le permiten tomar sangre para la prueba de otras zonas del cuerpo (  que no son el dedo). Estas reas se llaman sitios alternativos. Los sitios alternativos ms comunes son los siguientes:  El Product managerantebrazo.  El muslo.  La zona posterior de la parte inferior de la pierna.  La palma de la mano. El flujo de sangre en estas zonas es ms lento. Por lo tanto, los valores de glucosa en la sangre que obtenga pueden estar demorados, y los nmeros son diferentes de los que obtiene de los dedos. No saque sangre de sitios alternativos si cree que tiene hipoglucemia. Los valores no  sern precisos. Siempre extraiga del dedo si tiene hipoglucemia. Adems, si no puede darse cuenta cuando tiene bajos los niveles (hipoglucemia asintomtica), siempre extraiga sangre de los dedos para los controles de glucosa en la [place] Arrow[place]. CONSEJOS ADICIONALES PARA EL CONTROL DE LA GLUCOSA  No vuelva a utilizar las lancetas.  Siempre tenga los insumos a mano.  Todos los medidores de glucosa incluyen un nmero de telfono "directo", disponible las 24 horas, al que podr llamar si tiene preguntas o French Southern Territoriesnecesita ayuda.  Ajuste (calibre) el medidor de glucosa con una solucin de control despus de terminar algunas cajas de tiras reactivas. LLEVE REGISTROS DE LOS NIVELES DE GLUCOSA EN LA SANGRE Es recomendable llevar un diario o un registro de los valores de glucosa en la [place]. La Harley-Davidsonmayora de los medidores de glucosa, sino todos, conservan el registro de la glucosa en el dispositivo. Algunos medidores permiten descargar los registros a su computadora. Llevar un registro de los valores de glucosa en la sangre es especialmente til si desea observar los patrones. Haga anotaciones simultneas con la Microbiologistlectura de los valores de glucosa en la sangre debido a que podra olvidar lo que ocurri en el momento exacto. Llevar un buen registro los ayudar a usted y al mdico a Printmakertrabajar juntos para Personnel officerlograr un buen control de la diabetes.  Document Released: 05/31/2005 Document Revised: 10/15/2013 [facility] Patient Information 2015 [place], [place]. This information is not intended to replace advice given to you by your health care provider. Make sure you discuss any questions you have with your health care provider.

## 2014-09-12 NOTE — Discharge Summary (Signed)
Physician Discharge Summary  Ronnie Yates EQA:[id number] DOB: 03-21-1971 DOA: 09/09/2014  PCP: No PCP Per Patient  Admit date: 09/09/2014 Discharge date: 09/12/2014  Time spent: 30 minutes  Recommendations for Outpatient Follow-up:  1. Discharge home [facility] Pt is being discharged on oral hypoglycemic and insulin. 2. Nonweight bering on the left leg until symptoms improved. Follow up with orthopedics Dr Erlinda Hong in 6 weeks. 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 He also reports polyuria and polydipsia. CT scan of the foot showing charcoaled appearance with destructive changes and no osteomyelitis. Patient admitted to [facility] for further management.  Hospital Course:  Uncontrolled diabetes mellitus type 2 with charcot foot Supportive care with when necessary pain medications. 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 Recommend follow-up in the office in 6 weeks. CAM walker provided. Seen by PT and patient will be provided with crutches. Hemoglobin A1c of 12.8. Patient will be discharged on NovoLog mix 70/30, 12 units twice daily (due to cost) and metformin 500 mg twice daily. He has an appointment at the [facility] tomorrow morning. -Patient has been provided with glucometer and insulin supplies. He has been provided  education on diet and blood glucose monitoring. Also discussed about complications and awareness for hypoglycemic symptoms. -also has microalbuminuria. Will prescribe low-dose lisinopril.  Procedures:  none  Consultations:  Ortho (Dr Erlinda Hong)  Discharge Exam: Filed Vitals:   09/12/14 0534  BP: 130/79  Pulse: 71  Temp: 98 F (36.7 C)  Resp: 18    General: Middle aged male in no acute distress HEENT: No pallor, moist oral mucosa Chest: Clear to auscultation bilaterally, no added sounds CVS: Normal S1 and S2, no murmurs GI: Soft, nondistended, nontender, bowel sounds present Musculoskeletal: Warm, swelling over left foot with some tenderness and deformed joint, distal pulses palpable CNS: Alert and oriented Discharge Instructions    Current Discharge Medication List    START taking these medications   Details  Alcohol Swabs 70 % PADS 1 application by Does not apply route 2 (two) times daily before a meal. Qty: 100 each, Refills: 0    glucose monitoring kit (FREESTYLE) monitoring kit 1 each by Does not apply route 4 (four) times daily - after meals and at bedtime. 1 month Diabetic Testing Supplies for QAC-QHS accuchecks. Qty: 1 each, Refills: 1    HYDROcodone-acetaminophen (VICODIN) 5-500 MG per tablet Take 2 tablets by mouth every 6 (six) hours as needed for pain. Qty: 20 tablet, Refills: 0    insulin aspart protamine- aspart (NOVOLOG MIX 70/30) (70-30) 100 UNIT/ML injection Inject 0.12 mLs (12 Units total) into the skin 2 (two) times daily with a meal. Qty: 10 mL, Refills: 6    Insulin Syringe-Needle U-100 (INSULIN SYRINGE 1CC/28G) 28G X 1/2" 1 ML MISC 1 application by Does not apply route 2 (two) times daily before  a meal. Qty: 100 each, Refills: 0    Lancets (ACCU-CHEK MULTICLIX) lancets Use as instructed Qty: 100 each, Refills: 5    metFORMIN (GLUCOPHAGE) 500 MG tablet Take 1 tablet (500 mg total) by mouth 2 (two) times daily with a meal. Qty: 60 tablet, Refills: 0   Lisinopril 5 mg tablet                              take 1 tablet (5 mg was parenchymal by mouth once daily                                                                Qty: 30 tablets, 0 refills    CONTINUE these medications which have NOT CHANGED   Details  ibuprofen (ADVIL,MOTRIN) 200 MG tablet Take 400 mg by mouth every 6 (six) hours as needed for mild pain or moderate pain.       No Known Allergies Follow-up Information    Follow up with Marianna Payment, MD In 6 weeks.   Specialty:  Orthopedic Surgery   Contact information:   [place] [address] [phone]       Follow up with [facility]    .   Why:   You have an appointment on Friday, April 1 , 2016 at 9:30am with Dr. Gilles Chiquito at [facility]. ,    Contact information:   [address] [place] [address] [phone]       The results of significant diagnostics from this hospitalization (including imaging, microbiology, ancillary and laboratory) are listed below for reference.    Significant Diagnostic Studies: Ct Foot Left W Contrast  09/09/2014   CLINICAL DATA:  Left foot pain, swelling, tenderness for 2 weeks. No known injury.  EXAM: CT OF THE LEFT FOOT WITH CONTRAST  TECHNIQUE: Multidetector CT imaging was performed following the standard protocol during bolus administration of intravenous contrast.  CONTRAST:  116m OMNIPAQUE IOHEXOL 300 MG/ML  SOLN  COMPARISON:  None.  FINDINGS: Charcot appearance of the foot with disorganization, osseous destructive change, and abnormal alignment of the midfoot. Multifocal well-defined erosions involving the base of the metatarsals and at the tarsal metatarsal articulations. With sparing of the fifth metatarsal. Diffuse soft tissue edema of the foot. Within the intrinsic musculature of the foot subjacent to the first metatarsal with an elongated and 2.3 x 1.4 x 1.2 cm fluid collection,  this is adjacent to a in small hyperdensity that may be an osseous fragment. Diffuse muscular edema about the intrinsic musculature of the foot. There is a plantar calcaneal spur and Achilles tendon enthesophyte. Limited assessment for acute fracture given the underlying chronic change.  IMPRESSION: 1. Charcot appearance of the foot with osseous destructive change and disorganization involving the midfoot with multifocal ill-defined lucencies. Acute osteomyelitis is difficult to differentiate from chronic osseous change in the setting. 2. Small intramuscular fluid collection involving the intrinsic musculature of fluid subjacent to the first metatarsal measuring 2.3 x 1.4 x 1.2 cm, with a small probable osseous fragment, concerning for abscess.   Electronically Signed   By: MJeb LeveringM.D.   On: 09/09/2014 21:43   Mr Foot  Left W Wo Contrast  09/10/2014   CLINICAL DATA:  LEFT foot swelling over the last 2 weeks. Osteomyelitis.No systemic infectious symptoms.  EXAM: MRI OF THE LEFT FOREFOOT WITHOUT AND WITH CONTRAST  TECHNIQUE: Multiplanar, multisequence MR imaging was performed both before and after administration of intravenous contrast.  CONTRAST:  67m MULTIHANCE GADOBENATE DIMEGLUMINE 529 MG/ML IV SOLN  COMPARISON:  CT 09/09/2014.  FINDINGS: Severe destructive changes of the midfoot are present with bone marrow edema in the midfoot bones and in the metatarsals. 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  Fluid collection identified on prior CT is again noted. The entire plantar foot musculature shows enhancement and this fluid collection shows no enhancement centrally. The bony fragment demonstrated on prior CT is centrally within the collection. The collection measures 15 mm plantar to dorsal, 12 mm transverse and 22 mm in the long axis of the foot. This collection is plantar to the first and second metatarsal shafts.  Visible portions of the ankle  appear normal. Old plantar fasciitis. Achilles tendon appears intact. Diffuse leg, ankle and foot edema. Severe first MTP joint osteoarthritis.  The superficial soft tissues show phlegmon, edema and enhancement. There is 1 possible tiny area of ulceration along the medial forefoot adjacent to the first metatarsal shaft (image 31 series 10) however this does not extend into the deep soft tissues or bone and this may simply represent artifact from compression of the skin surface.  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 Correlation with hemoglobin A1C is recommended. A diffuse midfoot infection is considered unlikely, especially in the absence of an ulceration. Forefoot fluid collection is probably sequela of destructive changes of the midfoot, likely hematoma or ganglion cyst. 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   Electronically Signed   By: GDereck LigasM.D.   On: 09/10/2014 08:31   Dg Foot Complete Left  09/10/2014   CLINICAL DATA:  Foot swelling for 2 weeks  EXAM: LEFT FOOT - COMPLETE 3+ VIEW  COMPARISON:  None.  FINDINGS: There is a Lisfranc fracture, dislocation, with disorganization and fragmentation. The base of the first metatarsal is completely displaced laterally with respect to the medial cuneiform. There is bone loss at the medial first metatarsal base likely related to a fracture. The base of the second metatarsal is separated from the medial cuneiform with irregularity compatible with a fracture dislocation. The remainder of the 3 metatarsals are subluxated laterally as well with respect to the role of tarsal bones. All of these findings have a chronic or subacute appearance.  IMPRESSION: Chronic or subacute fracture and Lisfranc dislocation most  consistent with a Charcot foot or neuropathic diabetic foot.   Electronically Signed   By: AMarybelle KillingsM.D.   On: 09/10/2014 09:18    Microbiology: Recent Results (from the past 240 hour(s))  Culture, blood (routine x 2)     Status: None (Preliminary result)   Collection Time: 09/09/14 11:20 PM  Result Value Ref Range Status   Specimen Description BLOOD RIGHT HAND  Final   Special Requests BOTTLES DRAWN AEROBIC AND ANAEROBIC 6[place] Final   Culture PENDING  Incomplete   Report Status PENDING  Incomplete  Culture, blood (routine x 2)     Status: None (Preliminary result)   Collection Time: 09/09/14  11:35 PM  Result Value Ref Range Status   Specimen Description BLOOD LEFT ANTECUBITAL  Final   Special Requests BOTTLES DRAWN AEROBIC AND ANAEROBIC 8CC EACH  Final   Culture PENDING  Incomplete   Report Status PENDING  Incomplete     Labs: Basic Metabolic Panel:  Recent Labs Lab 09/09/14 1930 09/11/14 0448 09/12/14 0740  NA 134* 138 138  K 4.1 3.4* 3.9  CL 100 105 104  CO2 '26 29 26  ' GLUCOSE 328* 207* 220*  BUN '18 7 7  ' CREATININE 0.69 0.59 0.54  CALCIUM 9.3 8.4 8.7   Liver Function Tests:  Recent Labs Lab 09/11/14 0448 09/12/14 0740  AST 17 18  ALT 17 17  ALKPHOS 147* 139*  BILITOT 0.7 0.5  PROT 6.2 6.4  ALBUMIN 3.3* 3.2*   No results for input(s): LIPASE, AMYLASE in the last 168 hours. No results for input(s): AMMONIA in the last 168 hours. CBC:  Recent Labs Lab 09/09/14 1930 09/11/14 0448 09/12/14 0740  WBC 10.2 7.4 5.5  NEUTROABS 6.7 4.1 3.3  HGB 15.0 13.9 13.0  HCT 43.1 41.0 40.3  MCV 78.5 79.8 80.4  PLT 312 279 273   Cardiac Enzymes: No results for input(s): CKTOTAL, CKMB, CKMBINDEX, TROPONINI in the last 168 hours. BNP: BNP (last 3 results) No results for input(s): BNP in the last 8760 hours.  ProBNP (last 3 results) No results for input(s): PROBNP in the last 8760 hours.  CBG:  Recent Labs Lab 09/11/14 1702 09/11/14 2056  09/12/14 0011 09/12/14 0540 09/12/14 0806  GLUCAP 262* 202* 189* 210* 206*       Signed:  Taylinn Brabant  [facility] 09/12/2014, 10:19 AM

## 2014-09-12 NOTE — Progress Notes (Signed)
  Inpatient Diabetes Program Recommendations  AACE/ADA: New Consensus Statement on Inpatient Glycemic Control (2013)  Target Ranges:  Prepandial:   less than 140 mg/dL      Peak postprandial:   less than 180 mg/dL (1-2 hours)      Critically ill patients:  140 - 180 mg/dL   Results for Ronnie ShellingEREZ, Ronnie Yates (MRN [id number]) as of 09/12/2014 13:36  Ref. Range 09/11/2014 20:56 09/12/2014 00:11 09/12/2014 05:40 09/12/2014 08:06 09/12/2014 12:08  Glucose-Capillary Latest Range: 70-99 mg/dL 409202 (H) 811189 (H) 914210 (H) 206 (H) 218 (H)    CBG remain elevated despite Novolog correction insulin- fasting CBG 206mg /dl - requires basal insulin    1. Please consider starting Metformin 500 mg bid for patient if he has no contraindications- Will need to wait at least 48 hours since patient had contrast yesterday with MRI  2. Please consider 70/30 insulin at time of d/c. Patient does not have insurance and will not be able to afford Lantus, Levemir, or Novolog since they are so expensive out of pocket. 70/30 insulin [facility] for a low price and patient [facility] also [facility] per [facility]ial.  Would start Novolog 70/30 Mix insulin- 12 units bidwc (this would be ~0.2 units/kg basal dosing)   Ronnie RacerJulie Idus Rathke, RN, BA, [place]HA, CDE Diabetes Coordinator Inpatient Diabetes Program  [phone] (Team Pager) [phone] Patrcia Dolly([place] Office) 09/12/2014 1:38 PM

## 2014-09-12 NOTE — Evaluation (Signed)
Physical Therapy Evaluation Patient Details Name: Ronnie Yates MRN: [id number] DOB: 1971/04/23 Today's Date: 09/12/2014   History of Present Illness  44 y.o. male admitted to [facility] on 09/09/14 from [facility] due to L foot swelling, pain.  Pt transferred to [facility] for ortho consult and continued f/u re: new dx of DM 2.  Pt has no significant PMHx listed in his chart.  Clinical Impression  Pt is able to demonstrate competency with gait and stairs with axillary crutches (enough to d/c home today if MD allows).  PT will follow acutely to reinforce education if he is still here tomorrow.   PT to follow acutely for deficits listed below.       Follow Up Recommendations No PT follow up;Supervision - Intermittent    Equipment Recommendations  Other (comment) (axillary crutches, RN made aware)    Recommendations for Other Services   NA    Precautions / Restrictions Precautions Precautions: Fall Precaution Comments: due to NWB status Required Braces or Orthoses: Other Brace/Splint Other Brace/Splint: ACM boot Restrictions Weight Bearing Restrictions: Yes LLE Weight Bearing: Non weight bearing      Mobility  Bed Mobility Overal bed mobility: Independent                Transfers Overall transfer level: Needs assistance Equipment used: Crutches Transfers: Sit to/from Stand Sit to Stand: Supervision         General transfer comment: supervision for safety due to coming up and down over one leg.  Ambulation/Gait Ambulation/Gait assistance: Min guard Ambulation Distance (Feet): 100 Feet Assistive device: Crutches Gait Pattern/deviations: Step-to pattern (hop to pattern) Gait velocity: decreased Gait velocity interpretation: Below normal speed for age/gender General Gait Details: Taught pt hop to gait pattern through demonstration with crutches.  Pt donned his tennis shoe from home and therapist educated him on donning the CAM boot for his left foot.  Pt was able to maintain NWB on his  left throughout gait. Verbal cues for slow speed and he did fatigue and become more unsteady towards the end of gait.  We rested in a chair before practicing the steps.  Stairs Stairs: Yes Stairs assistance: Min guard Stair Management: One rail Right;One rail Left;Step to pattern;Forwards;With crutches (right rail up, left rail down, hop to pattern) Number of Stairs: 4 (2 steps x 2) General stair comments: Verbal cues and visual demonstration of correct technique, practiced twice to reinforce correct technique.       Balance Overall balance assessment: Needs assistance Sitting-balance support: Feet supported;No upper extremity supported Sitting balance-Leahy Scale: Good     Standing balance support: No upper extremity supported;Bilateral upper extremity supported;Single extremity supported Standing balance-Leahy Scale: Fair                               Pertinent Vitals/Pain Pain Assessment: 0-10 Pain Score: 5  Pain Location: left foot Pain Descriptors / Indicators: Aching;Burning Pain Intervention(s): Limited activity within patient's tolerance;Monitored during session;Repositioned    Home Living Family/patient expects to be discharged to:: Private residence Living Arrangements: Alone Available Help at Discharge: Friend(s);Available PRN/intermittently Type of Home: Mobile home Home Access: Stairs to enter Entrance Stairs-Rails: Right;Left;Can reach both Entrance Stairs-Number of Steps: 2-3 Home Layout: One level Home Equipment: None      Prior Function Level of Independence: Independent               Hand Dominance   Dominant Hand: Right    Extremity/Trunk  Assessment   Upper Extremity Assessment: Overall WFL for tasks assessed           Lower Extremity Assessment: LLE deficits/detail   LLE Deficits / Details: left foot with signficiant edema compared to right foot.  Educated re: donning CAM boot.  Pt able to wiggle and feel toes.  Knee and  hip WNL.  Cervical / Trunk Assessment: Normal  Communication   Communication: Prefers language other than English (Spanish speaking originally)  Cognition Arousal/Alertness: Awake/alert Behavior During Therapy: WFL for tasks assessed/performed Overall Cognitive Status: Within Functional Limits for tasks assessed                      General Comments General comments (skin integrity, edema, etc.): Educated pt re: keeping his left leg elevated for edema control.           Assessment/Plan    PT Assessment Patient needs continued PT services  PT Diagnosis Difficulty walking;Abnormality of gait;Generalized weakness;Acute pain   PT Problem List Decreased strength;Decreased balance;Decreased mobility;Decreased knowledge of use of DME;Decreased range of motion;Decreased activity tolerance;Decreased knowledge of precautions;Pain  PT Treatment Interventions DME instruction;Gait training;Stair training;Functional mobility training;Therapeutic exercise;Therapeutic activities;Balance training;Neuromuscular re-education;Patient/family education;Modalities   PT Goals (Current goals can be found in the Care Plan section) Acute Rehab PT Goals Patient Stated Goal: to get his foot better, decrease pain, get back to work. PT Goal Formulation: With patient Time For Goal Achievement: 09/19/14 Potential to Achieve Goals: Good    Frequency Min 3X/week   Barriers to discharge Decreased caregiver support pt doesn't have 24/7 support at home.       End of Session Equipment Utilized During Treatment: Gait belt;Other (comment) (left CAM boot) Activity Tolerance: Patient limited by fatigue;Patient limited by pain Patient left: in chair;with call bell/phone within reach Nurse Communication: Mobility status;Other (comment) (needs axillary crutches for home use.)         Time: 1131-1157 PT Time Calculation (min) (ACUTE ONLY): 26 min   Charges:   PT Evaluation $Initial PT Evaluation Tier I:  1 Procedure PT Treatments $Gait Training: 8-22 mins        Agusta Hackenberg B. Sydnei Ohaver, PT, DPT [phone]   09/12/2014, 2:29 PM

## 2014-09-13 ENCOUNTER — Inpatient Hospital Stay: Payer: Self-pay | Admitting: Family Medicine

## 2014-09-14 LAB — CULTURE, BLOOD (ROUTINE X 2)
Culture: NO GROWTH
Culture: NO GROWTH

## 2016-08-31 IMAGING — DX DG FOOT COMPLETE 3+V*L*
3 series · 3 of 3 positions shown · non-contrast
Comparison: None.

CLINICAL DATA: Foot swelling for 2 weeks

EXAM:
LEFT FOOT - COMPLETE 3+ VIEW

[foot ap]
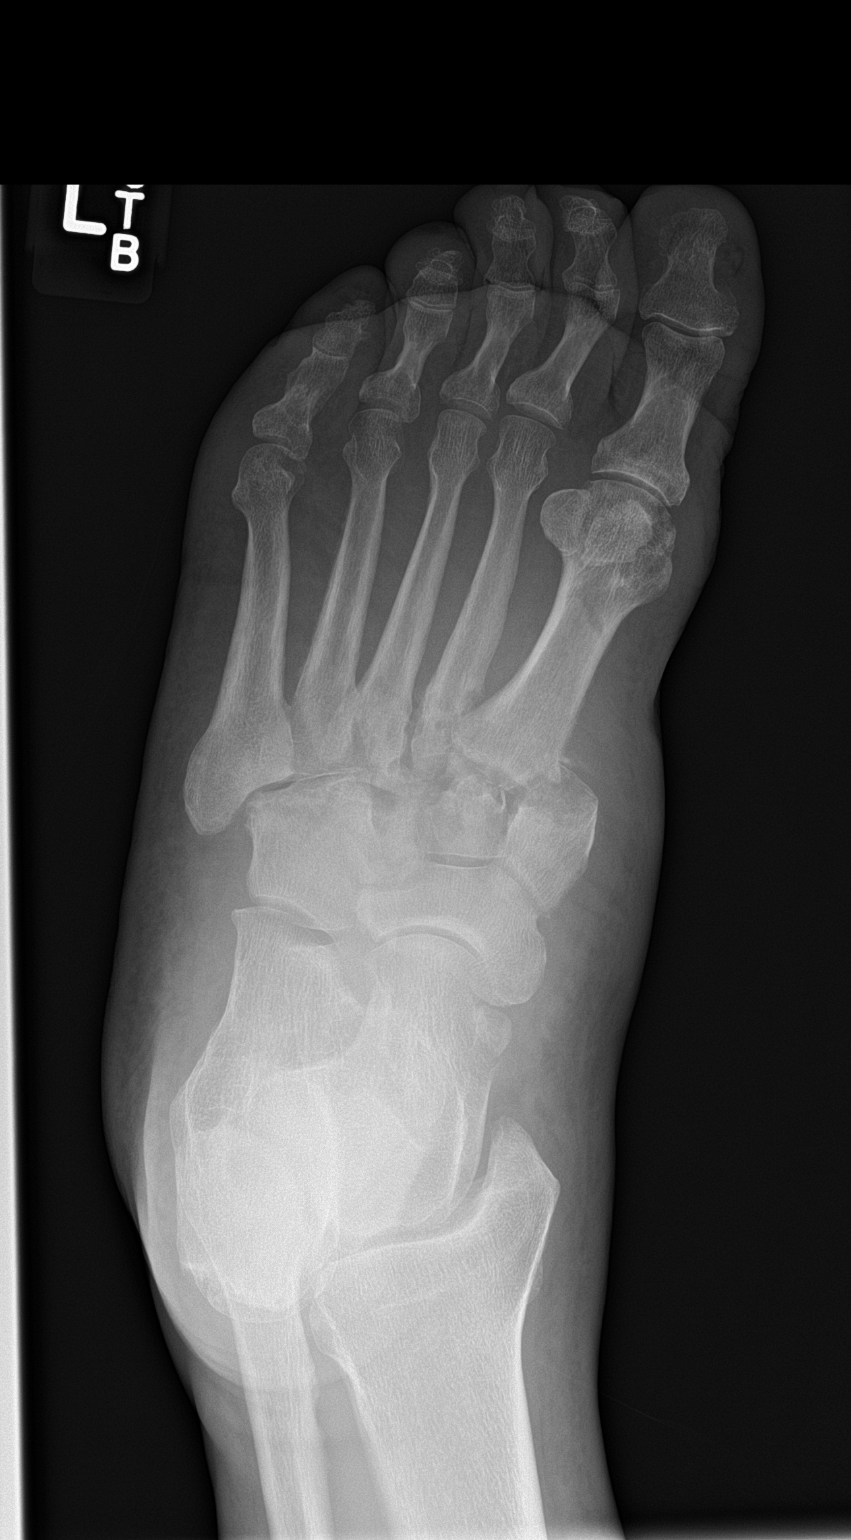

[foot obl]
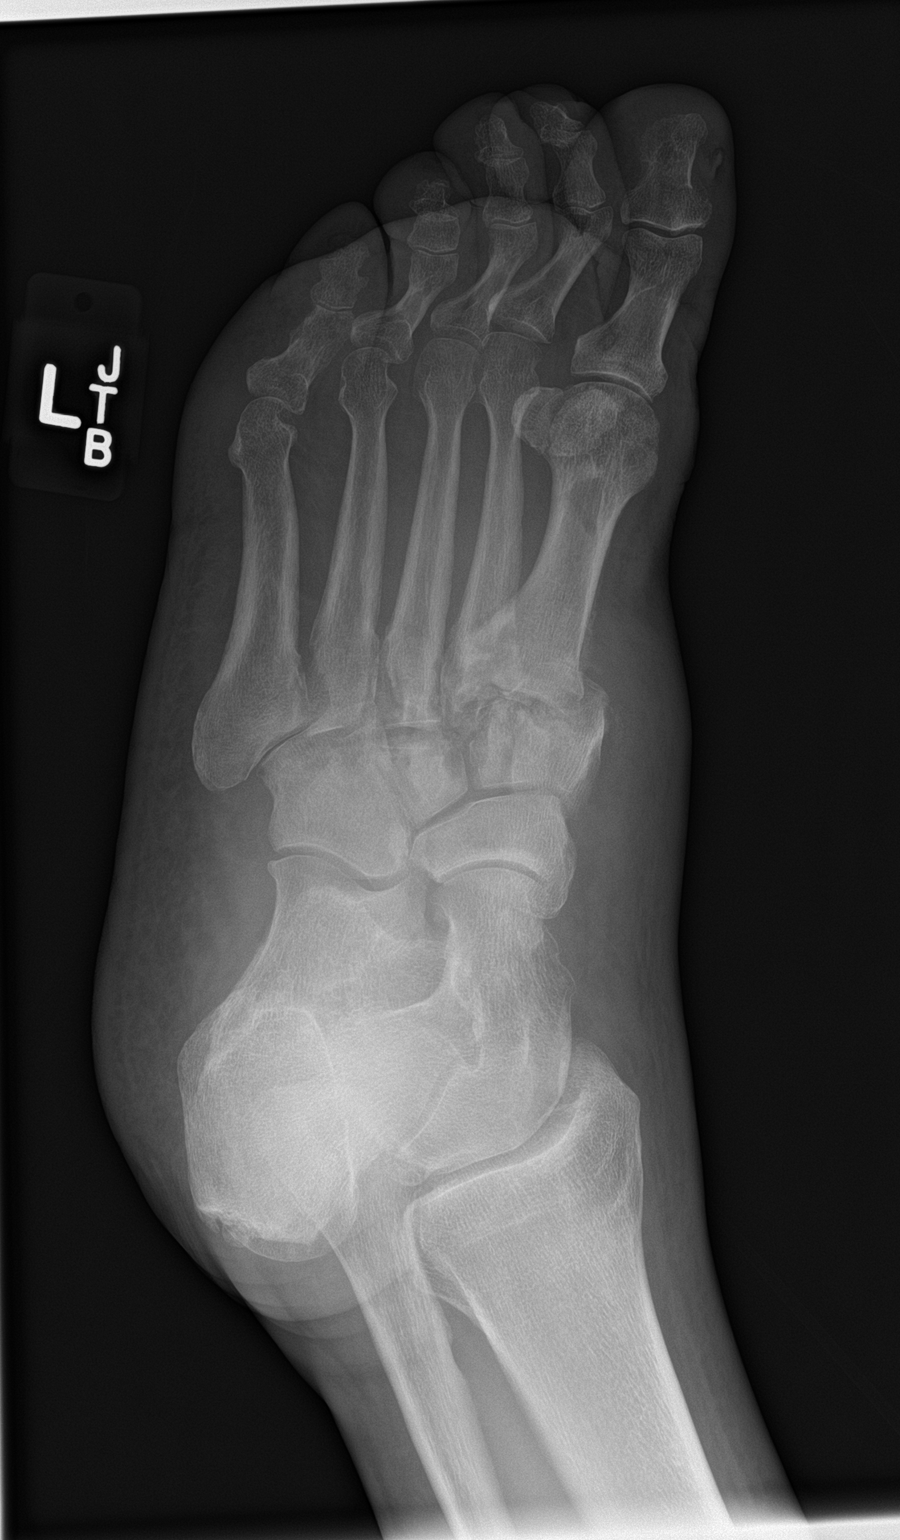

[foot lat]
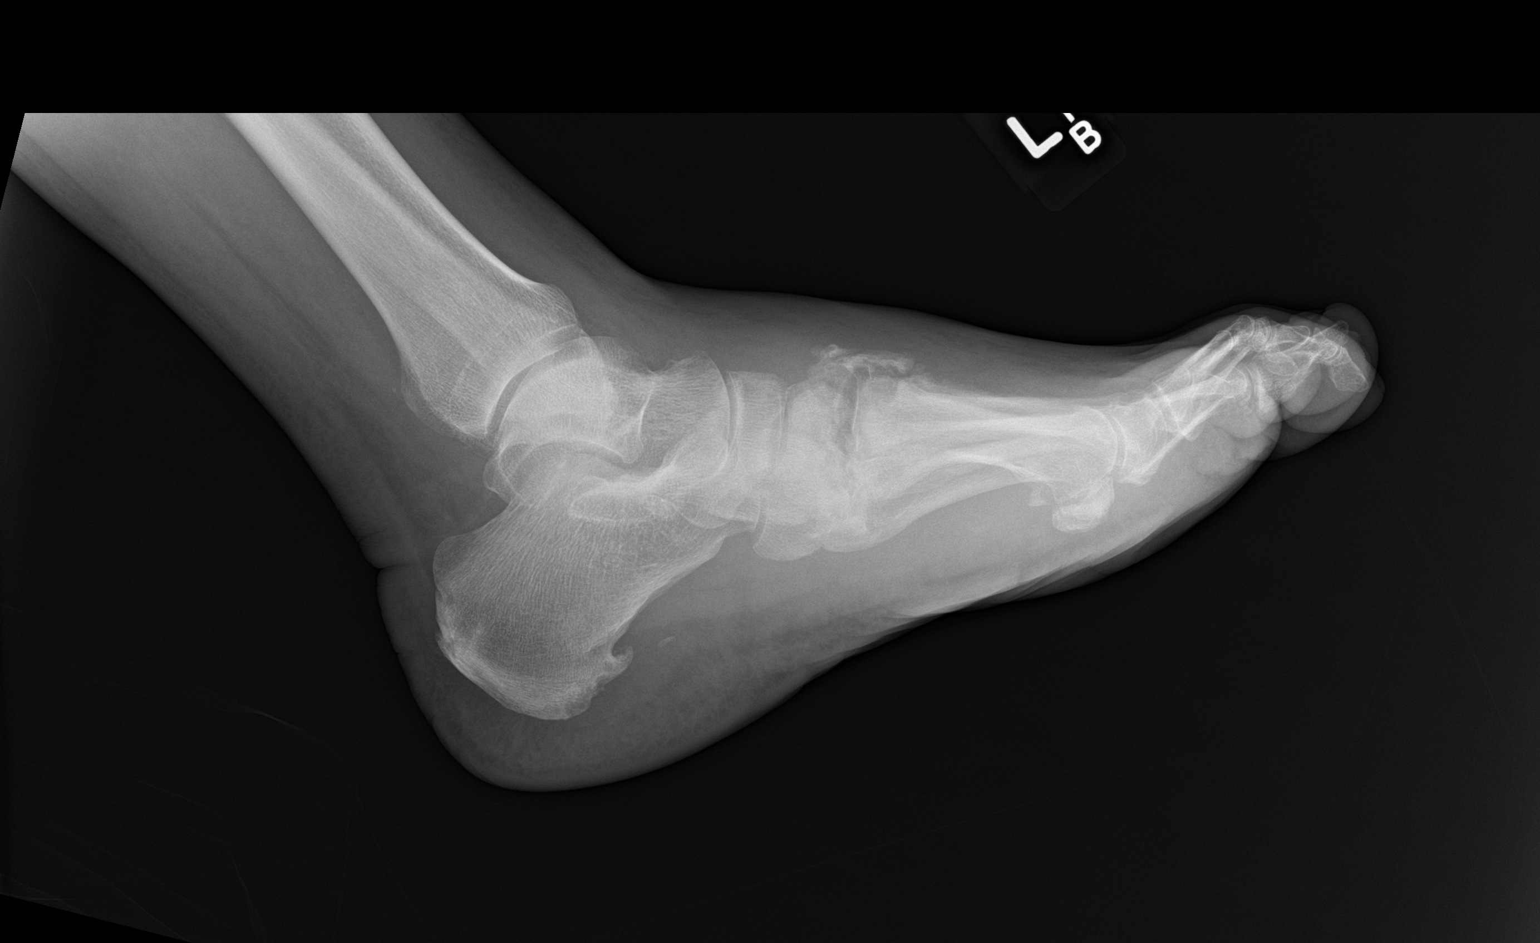

[3 of 3 positions shown; findings below may reference images not displayed]

FINDINGS: There is a Lisfranc fracture, dislocation, with disorganization and
fragmentation. The base of the first metatarsal is completely
displaced laterally with respect to the medial cuneiform. There is
bone loss at the medial first metatarsal base likely related to a
fracture. The base of the second metatarsal is separated from the
medial cuneiform with irregularity compatible with a fracture
dislocation. The remainder of the 3 metatarsals are subluxated
laterally as well with respect to the role of tarsal bones. All of
these findings have a chronic or subacute appearance.
IMPRESSION: Chronic or subacute fracture and Lisfranc dislocation most
consistent with a Charcot foot or neuropathic diabetic foot.

## 2016-08-31 IMAGING — MR MR FOOT*L* WO/W CM
5 of 9 series · 22 of 40 positions shown · IV contrast (Yes   MH)
Comparison: CT 09/09/2014.

CLINICAL DATA: LEFT foot swelling over the last 2 weeks.
Osteomyelitis.No systemic infectious symptoms.

EXAM:
MRI OF THE LEFT FOREFOOT WITHOUT AND WITH CONTRAST
TECHNIQUE: Multiplanar, multisequence MR imaging was performed both before and
after administration of intravenous contrast.
CONTRAST:  19mL MULTIHANCE GADOBENATE DIMEGLUMINE 529 MG/ML IV SOLN

[Series 4: T1 · coronal · 4.0mm · 0.27mm/px · 5 of 58 slices shown (1 of 2)]
[im 1/58]
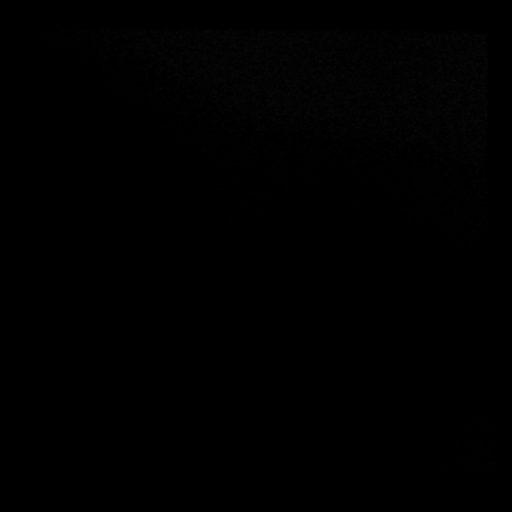
[im 15/58]
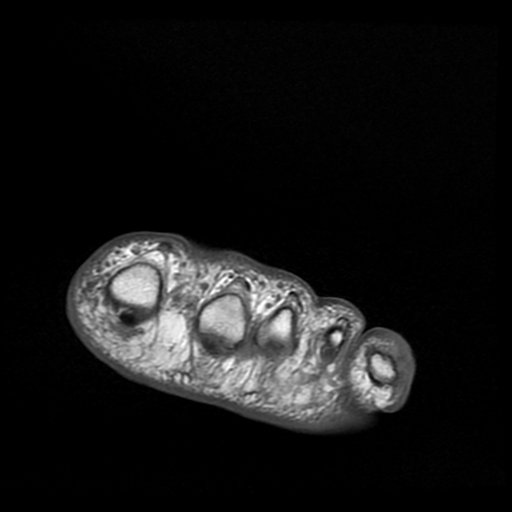
[im 29/58]
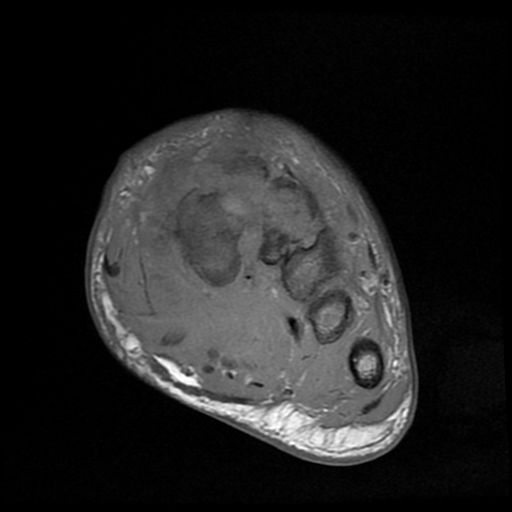
[im 43/58]
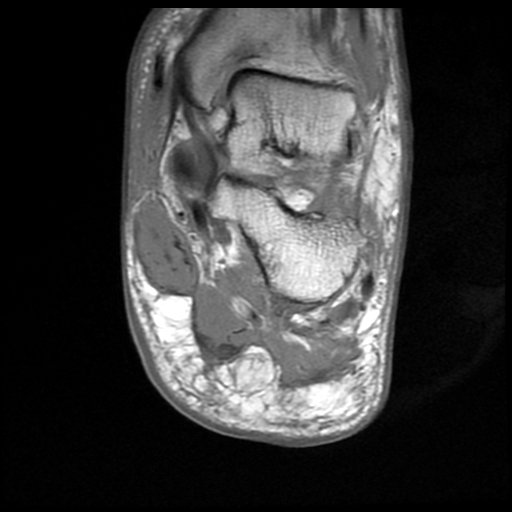
[im 58/58]
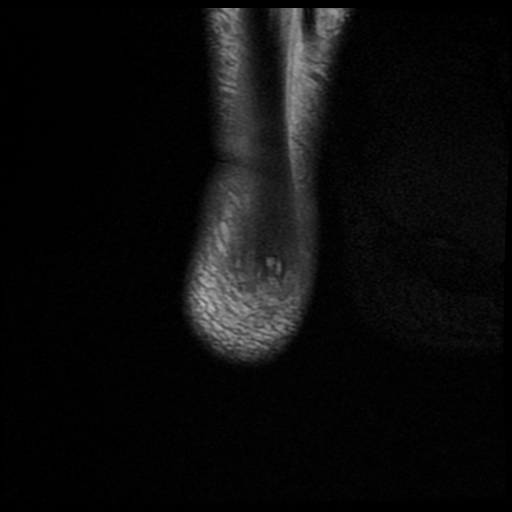

[Series 5: T1 fat-sat · coronal · non-contrast · 4.0mm · 0.27mm/px · 6 of 58 slices shown]
[im 1/58]
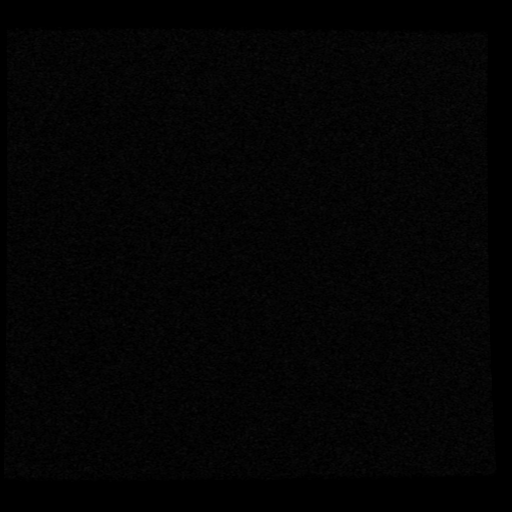
[im 12/58]
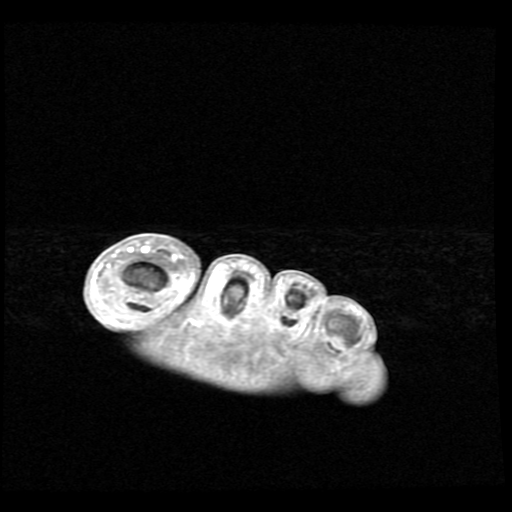
[im 23/58]
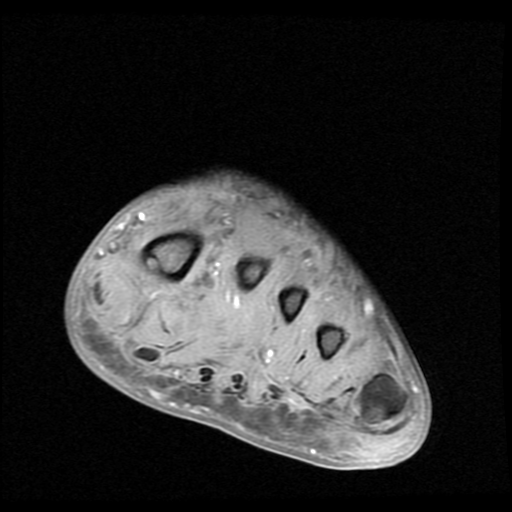
[im 35/58]
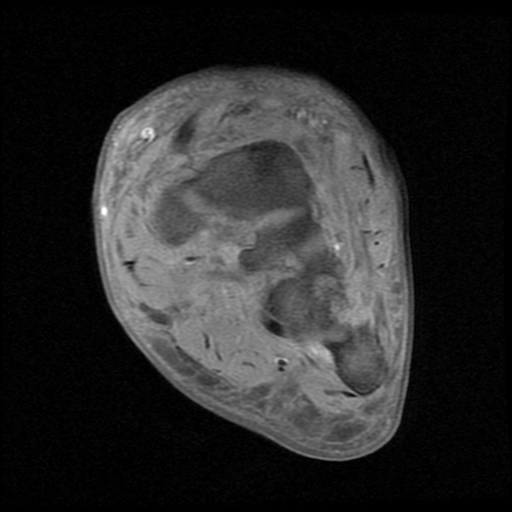
[im 46/58]
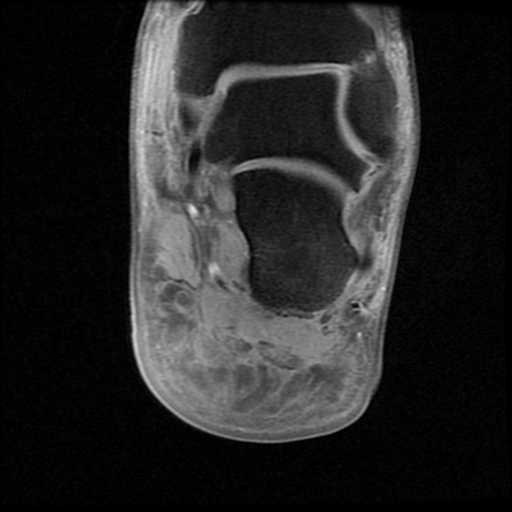
[im 58/58]
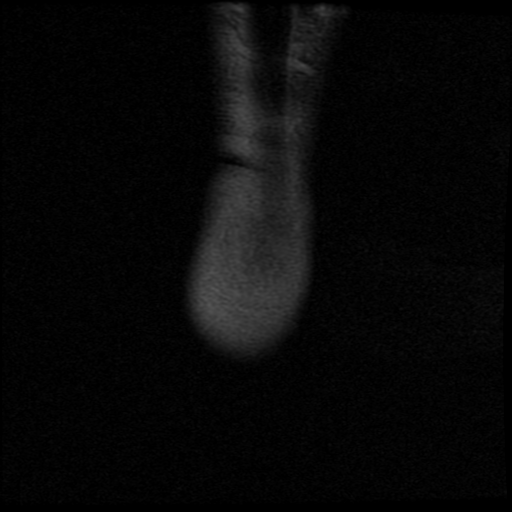

[Series 6: T1 · axial · 4.0mm · 0.55mm/px · z∈[-35,+72]mm · 3 of 26 slices shown (2 of 2)]
[im 1/26]
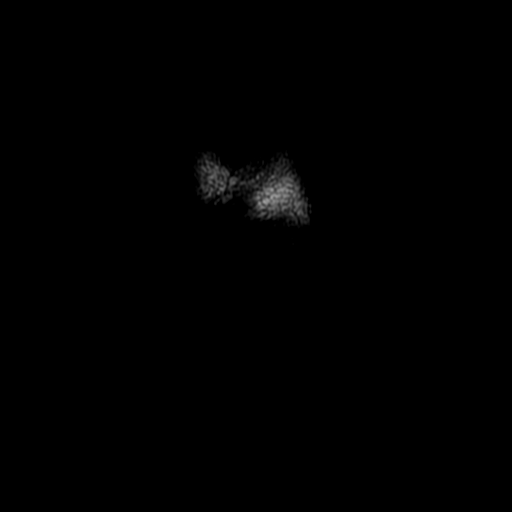
[im 13/26]
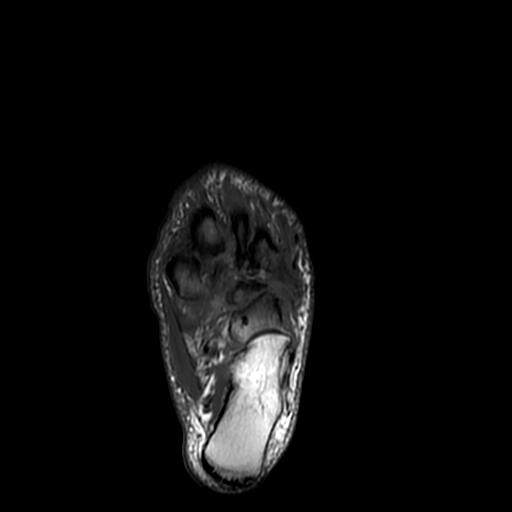
[im 26/26]
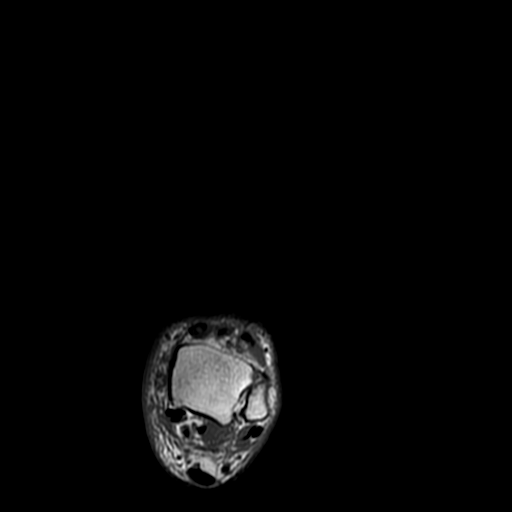

[Series 8: T2 fat-sat · coronal · 4.0mm · 0.55mm/px · 6 of 58 slices shown]
[im 1/58]
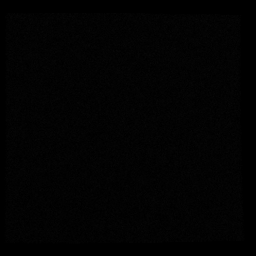
[im 12/58]
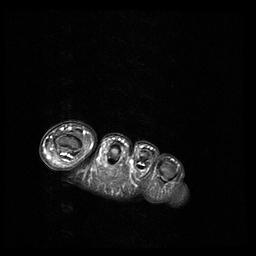
[im 23/58]
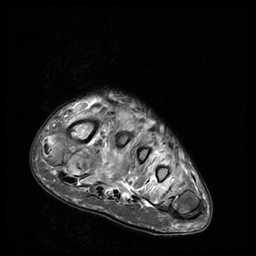
[im 35/58]
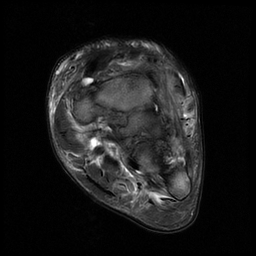
[im 46/58]
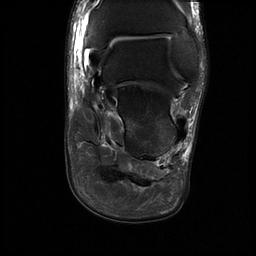
[im 58/58]
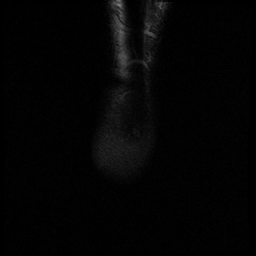

[Series 10: T1 fat-sat post-contrast · coronal · 4.0mm · 0.27mm/px · 2 of 58 slices shown]
[im 1/58]
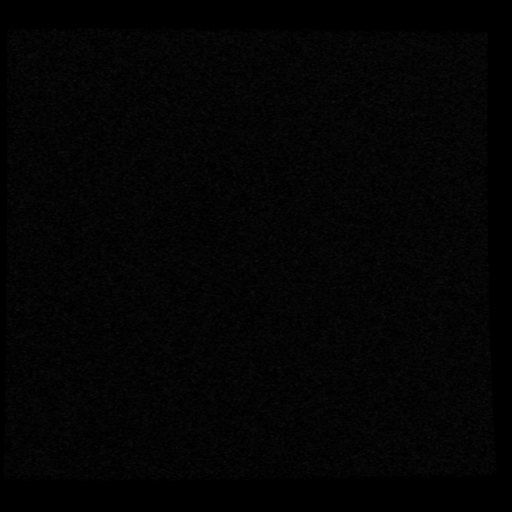
[im 12/58]
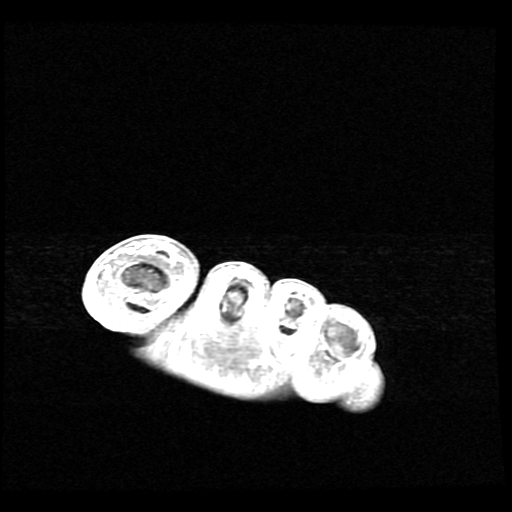

[22 of 40 positions shown; findings below may reference images not displayed]

FINDINGS: Severe destructive changes of the midfoot are present with bone
marrow edema in the midfoot bones and in the metatarsals. The
findings are most compatible with neuropathic midfoot, especially if
the patient is diabetic.

Fluid collection identified on prior CT is again noted. The entire
plantar foot musculature shows enhancement and this fluid collection
shows no enhancement centrally. The bony fragment demonstrated on
prior CT is centrally within the collection. The collection measures
15 mm plantar to dorsal, 12 mm transverse and 22 mm in the long axis
of the foot. This collection is plantar to the first and second
metatarsal shafts.

Visible portions of the ankle appear normal. Old plantar fasciitis.
Achilles tendon appears intact. Diffuse leg, ankle and foot edema.
Severe first MTP joint osteoarthritis.

The superficial soft tissues show phlegmon, edema and enhancement.
There is 1 possible tiny area of ulceration along the medial
forefoot adjacent to the first metatarsal shaft (image 31 series 10)
however this does not extend into the deep soft tissues or bone and
this may simply represent artifact from compression of the skin
surface.
IMPRESSION: Destructive changes of the midfoot are most compatible with
neuropathic midfoot, particularly if the patient is diabetic
(laboratory results show elevated glucose consistent with diabetes).
Correlation with hemoglobin A1C is recommended. A diffuse midfoot
infection is considered unlikely, especially in the absence of an
ulceration. Forefoot fluid collection is probably sequela of
destructive changes of the midfoot, likely hematoma or ganglion
cyst. Although infection cannot be completely excluded on imaging
alone, the findings are most compatible with neuropathic foot.
